# Patient Record
Sex: Male | Born: 1955 | Race: White | Hispanic: No | Marital: Married | Smoking: Current every day smoker
Health system: Southern US, Community
[De-identification: ages and names within clinical notes are randomized; demographics above are authoritative.]

## PROBLEM LIST (undated history)

## (undated) HISTORY — PX: KNEE SURGERY: SHX244

---

## 1998-03-04 ENCOUNTER — Emergency Department (HOSPITAL_COMMUNITY): Admission: EM | Admit: 1998-03-04 | Discharge: 1998-03-04 | Payer: Self-pay | Admitting: Emergency Medicine

## 2010-11-07 ENCOUNTER — Emergency Department (HOSPITAL_COMMUNITY)
Admission: EM | Admit: 2010-11-07 | Discharge: 2010-11-07 | Disposition: A | Discharge status: Home / Self Care | Payer: Self-pay | Attending: Emergency Medicine | Admitting: Emergency Medicine

## 2010-11-07 DIAGNOSIS — Z23 Encounter for immunization: Secondary | ICD-10-CM | POA: Insufficient documentation

## 2010-11-07 DIAGNOSIS — W208XXA Other cause of strike by thrown, projected or falling object, initial encounter: Secondary | ICD-10-CM | POA: Insufficient documentation

## 2010-11-07 DIAGNOSIS — S41109A Unspecified open wound of unspecified upper arm, initial encounter: Secondary | ICD-10-CM | POA: Insufficient documentation

## 2010-11-25 ENCOUNTER — Inpatient Hospital Stay (INDEPENDENT_AMBULATORY_CARE_PROVIDER_SITE_OTHER)
Admission: RE | Admit: 2010-11-25 | Discharge: 2010-11-25 | Disposition: A | Discharge status: Home / Self Care | Payer: Self-pay | Source: Ambulatory Visit | Attending: Family Medicine | Admitting: Family Medicine

## 2010-11-25 DIAGNOSIS — Z4802 Encounter for removal of sutures: Secondary | ICD-10-CM

## 2011-08-08 ENCOUNTER — Encounter: Payer: Self-pay | Admitting: Internal Medicine

## 2011-08-08 ENCOUNTER — Ambulatory Visit (INDEPENDENT_AMBULATORY_CARE_PROVIDER_SITE_OTHER): Payer: Self-pay | Admitting: Internal Medicine

## 2011-08-08 VITALS — BP 114/82 | HR 76 | Temp 97.6°F | Ht 71.0 in | Wt 181.5 lb

## 2011-08-08 DIAGNOSIS — F17200 Nicotine dependence, unspecified, uncomplicated: Secondary | ICD-10-CM | POA: Insufficient documentation

## 2011-08-08 DIAGNOSIS — M1990 Unspecified osteoarthritis, unspecified site: Secondary | ICD-10-CM | POA: Insufficient documentation

## 2011-08-08 DIAGNOSIS — M129 Arthropathy, unspecified: Secondary | ICD-10-CM

## 2011-08-08 DIAGNOSIS — N529 Male erectile dysfunction, unspecified: Secondary | ICD-10-CM

## 2011-08-08 LAB — CBC WITH DIFFERENTIAL/PLATELET
Basophils Absolute: 0 10*3/uL (ref 0.0–0.1)
Basophils Relative: 0 % (ref 0–1)
Eosinophils Absolute: 0.1 10*3/uL (ref 0.0–0.7)
Eosinophils Relative: 1 % (ref 0–5)
HCT: 44.3 % (ref 39.0–52.0)
Hemoglobin: 14.9 g/dL (ref 13.0–17.0)
Lymphocytes Relative: 24 % (ref 12–46)
Lymphs Abs: 2.3 10*3/uL (ref 0.7–4.0)
Neutro Abs: 6.8 10*3/uL (ref 1.7–7.7)
WBC: 9.6 10*3/uL (ref 4.0–10.5)

## 2011-08-08 LAB — LIPID PANEL
HDL: 45 mg/dL (ref >39)
LDL Cholesterol: 65 mg/dL (ref 0–99)
Total CHOL/HDL Ratio: 2.7 Ratio
Triglycerides: 57 mg/dL (ref <150)

## 2011-08-08 NOTE — Patient Instructions (Signed)
Please make a followup appointment in 4-6 weeks. I will give you call if any of the lab tests shows abnormal results.  Meanwhile keep taking with over-the-counter pain medications you take.

## 2011-08-09 ENCOUNTER — Encounter: Payer: Self-pay | Admitting: Internal Medicine

## 2011-08-09 LAB — COMPLETE METABOLIC PANEL WITH GFR
ALT: 11 U/L (ref 0–53)
Alkaline Phosphatase: 65 U/L (ref 39–117)
CO2: 30 mEq/L (ref 19–32)
Creat: 1.15 mg/dL (ref 0.50–1.35)
GFR, Est African American: 82 mL/min
Sodium: 141 mEq/L (ref 135–145)
Total Bilirubin: 0.3 mg/dL (ref 0.3–1.2)
Total Protein: 6.7 g/dL (ref 6.0–8.3)

## 2011-08-09 NOTE — Progress Notes (Signed)
  Subjective:    Patient ID: Clarence Lewis, male    DOB: [birth year], 56 y.o.   MRN: [id number]  HPI patient is a pleasant 56 year old man who comes to the clinic for getting established as a new patient. He hasn't seen a medical doctor for past 20 years. Was seen by orthopedist about 20 years before for his right knee arthroscopic surgery. Unclear about indication, but says that knee was severely swollen.  He does not have any history of hypertension, diabetes, hyperlipidemia- although he has never been checked for any of these. He has not been taking any prescription medication, but does take calcium tablets, fish oil- which he buys from [employer]. 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 He does smoke cigarettes- half-pack a day for last 41 years. Is a social alcohol drinker- 1-2 packs of liquor every week, and occasionally does marijuana- but not regularly.  He does complain of chronic polyarticular pain- about more than 10 years now. He complains of deep nagging pain in bilateral shoulders, elbows, wrists, MCP, PIP joints and 2nd and third right DIP joints, ankles and MTP joints- which gets worse with activity and work. The pain starts after a typical day of work- as he works as Corporate investment banker, has to do heavy physical activity. Does not have significant swelling of any joints- and has no monoarticular swelling and redness. Massage helps joint pain, while heat pads also helps sometimes.  He takes excessive Goody powder, Motrin/Advil- for her chronic pain. During periods of heavy work- he takes about 100 pills of Advil every week. Tylenol does not help him.  Although he does not have any abdominal pain, acid reflux, blood in stool/change in color of stool or nausea or vomiting. He also does not have any chest pain, shortness of breath, constipation, diarrhea, headache, palpitations, syncope, vision changes. Also has no fever, night sweats, recent weight loss or  change in appetite. He rarely gets sick- only in allergy season with sinusitis- which is helped by over-the-counter allergy medications. He does want prescription of Viagra.   Review of Systems    as per history of present illness, all other systems reviewed and negative. Objective:   Physical Exam  Constitutional: Vital signs reviewed.  Patient is a well-developed and well-nourished in no acute distress and cooperative with exam. Alert and oriented x3.  Head: Normocephalic and atraumatic Mouth: no erythema or exudates, MMM Eyes: PERRL, EOMI, conjunctivae normal, No scleral icterus.  Neck: Supple, Trachea midline normal ROM, No JVD, mass, thyromegaly, or carotid bruit present.  Cardiovascular: RRR, S1 normal, S2 normal, no MRG, pulses symmetric and intact bilaterally Pulmonary/Chest: CTAB, no wheezes, rales, or rhonchi Abdominal: Soft. Non-tender, non-distended, bowel sounds are normal, no masses, organomegaly, or guarding present.  Musculoskeletal: No joint deformities, erythema, or stiffness, ROM full and no nontender Neurological: A&O x3, Strenght is normal and symmetric bilaterally, cranial nerve II-XII are grossly intact, no focal motor deficit, sensory intact to light touch bilaterally.  Skin: Warm, dry and intact. No rash, cyanosis, or clubbing.  Psychiatric: Normal mood and affect. speech and behavior is normal. Judgment and thought content normal. Cognition and memory are normal.         Assessment & Plan:

## 2011-08-09 NOTE — Assessment & Plan Note (Addendum)
No apparent etiology surfaces out per his history- except long-term smoking. Although cannot rule out hyperlipidemia, diabetes, hypothyroidism- although does not have any classic symptoms of thyroid or diabetes. A recheck CBC with differential, CMP, TSH and lipid panel.  He wants a prescription of Viagra. I will wait to lab tests come back and I will see him again in 3-4 weeks and will discuss about it at that time.

## 2011-08-09 NOTE — Assessment & Plan Note (Signed)
Smokes about half pack a day for past 41 years. Not ready to quit at present. Explained him that his smoking might be causing his erectile dysfunction.

## 2011-08-09 NOTE — Assessment & Plan Note (Signed)
As per history and physical exam, it seems most likely this patient has osteoarthritis from joint overuse- he do heavy physical work all his life. He does not have any family history of autoimmune disorders or rheumatoid arthritis. He also does not have any B. symptoms and no other signs of an infectious or inflammatory process. I will check basic lab tests today- as he never had any blood work for about 20 years now. - Will check CBC with differential, CMP, TSH, lipid panel. -  I will see him back in 3-4 weeks and discuss the results and we'll try to figure out the best management plan for his arthritis- which will include combination of physical therapy, heat/cold therapy and minimalization of NSAID use. - I do not see any need of imaging at present- will pursue later if needed.

## 2011-08-29 ENCOUNTER — Encounter: Payer: Self-pay | Admitting: Internal Medicine

## 2011-08-29 ENCOUNTER — Ambulatory Visit (INDEPENDENT_AMBULATORY_CARE_PROVIDER_SITE_OTHER): Payer: Self-pay | Admitting: Internal Medicine

## 2011-08-29 VITALS — BP 121/81 | HR 70 | Temp 97.6°F | Ht 71.0 in | Wt 184.2 lb

## 2011-08-29 DIAGNOSIS — Z0000 Encounter for general adult medical examination without abnormal findings: Secondary | ICD-10-CM | POA: Insufficient documentation

## 2011-08-29 DIAGNOSIS — M1990 Unspecified osteoarthritis, unspecified site: Secondary | ICD-10-CM

## 2011-08-29 DIAGNOSIS — M129 Arthropathy, unspecified: Secondary | ICD-10-CM

## 2011-08-29 DIAGNOSIS — N529 Male erectile dysfunction, unspecified: Secondary | ICD-10-CM

## 2011-08-29 DIAGNOSIS — F17200 Nicotine dependence, unspecified, uncomplicated: Secondary | ICD-10-CM

## 2011-08-29 MED ORDER — SILDENAFIL CITRATE 50 MG PO TABS: 50.0000 mg | ORAL_TABLET | ORAL | Status: DC | PRN

## 2011-08-29 MED ORDER — MELOXICAM 7.5 MG PO TABS: 7.5000 mg | ORAL_TABLET | Freq: Every day | ORAL | Status: DC

## 2011-08-29 NOTE — Assessment & Plan Note (Addendum)
As per last note, likely OA from life of labor.  Labs all wnl. - rx of meloxicam trial - defer imaging - told him he can con't taking fish oil and joint supplements as he wishes - consider alternative dx (systemic dz) if next provider feels differently

## 2011-08-29 NOTE — Progress Notes (Signed)
Subjective:     Patient ID: Clarence Lewis, male   DOB: 12-06-1955, 56 y.o.   MRN: [id number]  HPI Very pleasant 56 yo M with arthritis and ED who presents for f/u  Pt c/o diffuse joint pains, intermittent w/o sigficiant edema or erythema.  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  Still has decreased libido, decreased achieve and maintain erections, decreased interest.  Review of Systems No CP, SOB    Objective:   Physical Exam GEN: well nourished, NAD CV: RRR, no m/r/g Chest: CTAB    Assessment:         Plan:

## 2011-08-29 NOTE — Assessment & Plan Note (Signed)
Likely multifactorial, mostly mental, with decreased interest, anxiety about job, money, bills.  May have some vascular component given smoking hx.  Less likely venous prob, doubt androgenic prob.  Pt amenable to viagra trial depending on cost.  Not that concerned about it to begin with. - trial of viagra if pt can afford it - can consider urology referral but pt has no insurance (would need orange card)

## 2011-08-29 NOTE — Assessment & Plan Note (Signed)
New to healthcare system. - colonoscopy: not that interested, but should con't to ask - smoking cessation is biggest risk - lipids wnl - bp wnl

## 2011-08-29 NOTE — Assessment & Plan Note (Signed)
Smokes about 1/2 PPD.  Some interest in quitting but his wife also smokes and she will not quit.  Will be VERY difficult unless she decides to quit with him. - con't to encourage

## 2011-09-05 ENCOUNTER — Encounter: Payer: Self-pay | Admitting: Internal Medicine

## 2011-10-10 ENCOUNTER — Encounter: Payer: Self-pay | Admitting: Internal Medicine

## 2011-10-10 ENCOUNTER — Ambulatory Visit (INDEPENDENT_AMBULATORY_CARE_PROVIDER_SITE_OTHER): Payer: Self-pay | Admitting: Internal Medicine

## 2011-10-10 VITALS — BP 123/87 | HR 72 | Temp 98.3°F | Ht 71.0 in | Wt 185.9 lb

## 2011-10-10 DIAGNOSIS — M129 Arthropathy, unspecified: Secondary | ICD-10-CM

## 2011-10-10 DIAGNOSIS — K029 Dental caries, unspecified: Secondary | ICD-10-CM | POA: Insufficient documentation

## 2011-10-10 DIAGNOSIS — N529 Male erectile dysfunction, unspecified: Secondary | ICD-10-CM

## 2011-10-10 DIAGNOSIS — M1990 Unspecified osteoarthritis, unspecified site: Secondary | ICD-10-CM

## 2011-10-10 MED ORDER — ACETAMINOPHEN-CODEINE #3 300-30 MG PO TABS: 1.0000 | ORAL_TABLET | ORAL | Status: DC | PRN

## 2011-10-10 NOTE — Assessment & Plan Note (Signed)
Viagra didn't help. He does not want to do anything at present. Will think about urology referral in future if needed.

## 2011-10-10 NOTE — Assessment & Plan Note (Signed)
After long discussion- will try Tylenol #3. He was concerned about side effects- "getting high". Discussed with him that this is the lowest narcotic dose. Just give it a try and see if that helps him. We'll try for a while. If he doesn't feel better- will proceed with further workup with imaging

## 2011-10-10 NOTE — Assessment & Plan Note (Signed)
Poor dental hygiene with missing anterior maxillary teeth. Does not have insurance. Does have orange card. I will refer him to dental clinic, and Stanton Kidney, RN will try to make appointment-if possible. Also provided him with free dental clinic information- which he will call.

## 2011-10-10 NOTE — Patient Instructions (Signed)
Please make followup appointment in 3-4 months or earlier if needed for dental workup or other pain. Meanwhile start taking Tylenol#3- 2 times a day as needed for pain.

## 2011-10-10 NOTE — Progress Notes (Signed)
  Subjective:    Patient ID: Clarence Lewis, male    DOB: [birth year], 56 y.o.   MRN: [id number]  HPI patient is a pleasant 56 year old man with past history significant for arthritis, cigarette smoking, erectile dysfunction- who comes the clinic for dental clinic referral. 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 Has multiple dental caries- with bad dental hygiene. Does not have insurance and so have never seen a dentist. Does not have any fever, chills, nausea, vomiting, bowel pain, chest pain, short of breath. - He was started on Viagra during last visit in June 2013 by Dr. Berlinda Last for erectile dysfunction. He took one tablet once- but did not help him with achieving erection- instead make him have palpitations and so he stopped taking it. He does not want to think about it or treat erectile dysfunction at present. - He still has pain all over his body from his arthritis as before- which is not controlled by meloxicam. Does not want to take anything which would make him high.    Review of Systems     is per history of present illness, all other systems reviewed and negative.  Objective:   Physical Exam  General: NAD HEENT: PERRL, EOMI, no scleral icterus. Multiple dental caries. Absent anterior maxillary teeth. Poor dental hygiene Cardiac: S1, S2, RRR, no rubs, murmurs or gallops Pulm: clear to auscultation bilaterally, moving normal volumes of air Abd: soft, nontender, nondistended, BS present Ext: warm and well perfused, no pedal edema Neuro: alert and oriented X3, cranial nerves II-XII grossly intact       Assessment & Plan:

## 2012-04-16 ENCOUNTER — Ambulatory Visit (INDEPENDENT_AMBULATORY_CARE_PROVIDER_SITE_OTHER): Payer: No Typology Code available for payment source | Admitting: Internal Medicine

## 2012-04-16 ENCOUNTER — Encounter: Payer: Self-pay | Admitting: Internal Medicine

## 2012-04-16 VITALS — BP 134/91 | HR 61 | Temp 97.1°F | Ht 71.0 in | Wt 193.1 lb

## 2012-04-16 DIAGNOSIS — Z23 Encounter for immunization: Secondary | ICD-10-CM

## 2012-04-16 DIAGNOSIS — K029 Dental caries, unspecified: Secondary | ICD-10-CM

## 2012-04-16 DIAGNOSIS — M1990 Unspecified osteoarthritis, unspecified site: Secondary | ICD-10-CM

## 2012-04-16 DIAGNOSIS — M129 Arthropathy, unspecified: Secondary | ICD-10-CM

## 2012-04-16 DIAGNOSIS — Z299 Encounter for prophylactic measures, unspecified: Secondary | ICD-10-CM

## 2012-04-16 DIAGNOSIS — F17200 Nicotine dependence, unspecified, uncomplicated: Secondary | ICD-10-CM

## 2012-04-16 DIAGNOSIS — Z0000 Encounter for general adult medical examination without abnormal findings: Secondary | ICD-10-CM

## 2012-04-16 MED ORDER — MELOXICAM 7.5 MG PO TABS: 7.5000 mg | ORAL_TABLET | Freq: Every day | ORAL | Status: AC

## 2012-04-16 MED ORDER — ACETAMINOPHEN-CODEINE #3 300-30 MG PO TABS: 1.0000 | ORAL_TABLET | ORAL | Status: AC | PRN

## 2012-04-16 NOTE — Assessment & Plan Note (Signed)
Still smoking half pack a day. Does not want to quit. Brief counseling given though.

## 2012-04-16 NOTE — Assessment & Plan Note (Addendum)
No significant new changes. She complains of pain in his fingers, elbows and shoulders all the time. It is helped by the medication- cannot remember if his Tylenol No. 3 or Mobic. Will send a prescription for both today and he will check the pill bottle at home and fill the one which helps him. We'll see him back if he has any new changes. He will call for refills.

## 2012-04-16 NOTE — Progress Notes (Signed)
  Subjective:    Patient ID: Clarence Lewis, male    DOB: 1955-06-02, 56 y.o.   MRN: [id number]  HPI patient is a pleasant 29 year man with history of arthritis, smoking and dental caries who comes for regular 6 month followup visit. He has no significant chronic medical problems. He got all his teeth removed 3 weeks ago and is waiting to get dentures- he has an appointment in a week from now. Overall he feels good. Denies any new symptoms. No chest pain, fever, chills, nausea vomiting or abdominal pain, diarrhea. He is out of his pain medications for about 3 months. He is unclear which worked for him- Tylenol #3 or meloxicam. He takes his medications after meals twice a day.   Review of Systems    as per history of present illness. Objective:   Physical Exam General: NAD HEENT: PERRL, EOMI, no scleral icterus Cardiac: RRR, no rubs, murmurs or gallops Pulm: clear to auscultation bilaterally, moving normal volumes of air Abd: soft, nontender, nondistended, BS present Ext: warm and well perfused, no pedal edema. Mild arthritic changes in MCP and PIP joints in bilateral hands. No swelling or redness. Neuro: alert and oriented X3, cranial nerves II-XII grossly intact        Assessment & Plan:

## 2012-04-16 NOTE — Assessment & Plan Note (Signed)
Flu shot given today

## 2012-04-16 NOTE — Patient Instructions (Signed)
Please make a yearly followup appointment or before that as needed for any other new issues. Call the clinic for medication refills. Continue taking all medications as you do.

## 2012-04-16 NOTE — Assessment & Plan Note (Signed)
Got all his teeth removed 3 weeks ago.   Appointment With dentist after a week from now for dentures.

## 2013-04-28 ENCOUNTER — Encounter: Payer: Self-pay | Admitting: Internal Medicine

## 2013-04-28 ENCOUNTER — Ambulatory Visit (INDEPENDENT_AMBULATORY_CARE_PROVIDER_SITE_OTHER): Payer: No Typology Code available for payment source | Admitting: Internal Medicine

## 2013-04-28 VITALS — BP 124/82 | HR 78 | Temp 97.7°F | Wt 188.0 lb

## 2013-04-28 DIAGNOSIS — Z0000 Encounter for general adult medical examination without abnormal findings: Secondary | ICD-10-CM

## 2013-04-28 DIAGNOSIS — M129 Arthropathy, unspecified: Secondary | ICD-10-CM

## 2013-04-28 DIAGNOSIS — M1990 Unspecified osteoarthritis, unspecified site: Secondary | ICD-10-CM

## 2013-04-28 DIAGNOSIS — F17200 Nicotine dependence, unspecified, uncomplicated: Secondary | ICD-10-CM

## 2013-04-28 LAB — CBC WITH DIFFERENTIAL/PLATELET
BASOS PCT: 0 % (ref 0–1)
Basophils Absolute: 0 10*3/uL (ref 0.0–0.1)
Eosinophils Absolute: 0.1 10*3/uL (ref 0.0–0.7)
Eosinophils Relative: 1 % (ref 0–5)
HEMATOCRIT: 45 % (ref 39.0–52.0)
HEMOGLOBIN: 15.3 g/dL (ref 13.0–17.0)
LYMPHS ABS: 2.7 10*3/uL (ref 0.7–4.0)
LYMPHS PCT: 25 % (ref 12–46)
MCH: 30.8 pg (ref 26.0–34.0)
MCHC: 34 g/dL (ref 30.0–36.0)
MCV: 90.5 fL (ref 78.0–100.0)
MONO ABS: 0.7 10*3/uL (ref 0.1–1.0)
MONOS PCT: 6 % (ref 3–12)
NEUTROS ABS: 7.2 10*3/uL (ref 1.7–7.7)
NEUTROS PCT: 68 % (ref 43–77)
Platelets: 236 10*3/uL (ref 150–400)
RBC: 4.97 MIL/uL (ref 4.22–5.81)
RDW: 14.6 % (ref 11.5–15.5)
WBC: 10.7 10*3/uL — AB (ref 4.0–10.5)

## 2013-04-28 LAB — BASIC METABOLIC PANEL WITH GFR
BUN: 10 mg/dL (ref 6–23)
CHLORIDE: 105 meq/L (ref 96–112)
CO2: 29 mEq/L (ref 19–32)
Calcium: 9.2 mg/dL (ref 8.4–10.5)
Creat: 1.16 mg/dL (ref 0.50–1.35)
GFR, EST AFRICAN AMERICAN: 81 mL/min
GFR, Est Non African American: 70 mL/min
GLUCOSE: 72 mg/dL (ref 70–99)
POTASSIUM: 4.1 meq/L (ref 3.5–5.3)
SODIUM: 142 meq/L (ref 135–145)

## 2013-04-28 LAB — LIPID PANEL
CHOL/HDL RATIO: 2.9 ratio
Cholesterol: 127 mg/dL (ref 0–200)
HDL: 44 mg/dL (ref >39)
LDL CALC: 53 mg/dL (ref 0–99)
TRIGLYCERIDES: 152 mg/dL — AB (ref <150)
VLDL: 30 mg/dL (ref 0–40)

## 2013-04-28 MED ORDER — MELOXICAM 7.5 MG PO TABS: 7.5000 mg | ORAL_TABLET | Freq: Every day | ORAL | Status: AC

## 2013-04-28 NOTE — Progress Notes (Signed)
Subjective:   Patient ID: Clarence Lewis male   DOB: May 09, 1955 58 y.o.   MRN: [id number]  HPI: Clarence Lewis is a 58 y.o. man who presents with a cc of bil hand pain. This pain has been present for many years. The pain was treated well with a medicine that the patient used about 6 months ago, but he cannot remember the medicine. The pain is made worse with physical activity. 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 The pain is located in his MCP and PIP joints. The pain is worse in the patients right hand. The patient is currently not taking any medicine for hit.    No past medical history on file. Current Outpatient Prescriptions  Medication Sig Dispense Refill  . fish oil-omega-3 fatty acids 1000 MG capsule Take 2 g by mouth daily.      . sildenafil (VIAGRA) 50 MG tablet Take 1 tablet (50 mg total) by mouth as needed for erectile dysfunction.  10 tablet  1   No current facility-administered medications for this visit.   Family History  Problem Relation Age of Onset  . Diabetes Mother   . Cancer Mother 1855    died of bone cancer   History   Social History  . Marital Status: Married    Spouse Name: N/A    Number of Children: N/A  . Years of Education: N/A   Social History Main Topics  . Smoking status: Current Every Day Smoker -- 1.00 packs/day for 41 years    Types: Cigarettes  . Smokeless tobacco: None  . Alcohol Use: 1.2 oz/week    2 Shots of liquor per week  . Drug Use: No  . Sexual Activity: Yes   Other Topics Concern  . None   Social History Narrative   Lives with [place] in [place].   Has kids, who lives in [place] and are healthy.   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   Plays guitar since he was 58 years old.   Review of Systems: Review of Systems  Constitutional: Negative for fever, chills and weight loss.  HENT: Negative  for sore throat.   Respiratory: Negative for cough, shortness of breath and wheezing.   Cardiovascular: Negative for chest pain, palpitations and orthopnea.  Gastrointestinal: Negative for heartburn, nausea, vomiting, abdominal pain, diarrhea and constipation.  Genitourinary: Negative for dysuria, urgency and frequency.  Musculoskeletal: Positive for joint pain.  Neurological: Negative for weakness and headaches.    Objective:  Physical Exam: Filed Vitals:   04/28/13 1412  BP: 124/82  Pulse: 78  Temp: 97.7 F (36.5 C)  TempSrc: Oral  Weight: 188 lb (85.276 kg)  SpO2: 96%   Physical Exam  Constitutional: He is oriented to person, place, and time. He appears well-developed and well-nourished.  HENT:  Head: Normocephalic.  Mouth/Throat: Oropharynx is clear and moist. No oropharyngeal exudate.  Cardiovascular: Normal rate, regular rhythm, normal heart sounds and intact distal pulses.  Exam reveals no gallop and no friction rub.   No murmur heard. Pulmonary/Chest: Effort normal. No respiratory distress. He has wheezes.  Mild wheezing in right lung  Musculoskeletal:  Patient has full motot strength in bil hands (grip), he is able to eprform rapid alternating movements, he can oppose his thumb w/o difficulty, wriste flexion and extension are normal. Biceps and triceps are 5/5. Intact to sensation bil. Pulses 2+  bil.  DIP tender to palpation. Pain increases with grip strength. Right is worse than left.   Neurological: He is alert and oriented to person, place, and time.  Psychiatric: He has a normal mood and affect. His behavior is normal.    Assessment & Plan:

## 2013-04-28 NOTE — Patient Instructions (Signed)
Please take 1 meloxicam each day for your arthritis.   I will call you with the results of your blood work,.   Please complete the stool cards and send them for analysis. You will be contactd with the results.   Arthritis, Nonspecific Arthritis is inflammation of a joint. This usually means pain, redness, warmth or swelling are present. One or more joints may be involved. There are a number of types of arthritis. Your caregiver may not be able to tell what type of arthritis you have right away. CAUSES  The most common cause of arthritis is the wear and tear on the joint (osteoarthritis). This causes damage to the cartilage, which can break down over time. The knees, hips, back and neck are most often affected by this type of arthritis. Other types of arthritis and common causes of joint pain include:  Sprains and other injuries near the joint. Sometimes minor sprains and injuries cause pain and swelling that develop hours later.  Rheumatoid arthritis. This affects hands, feet and knees. It usually affects both sides of your body at the same time. It is often associated with chronic ailments, fever, weight loss and general weakness.  Crystal arthritis. Gout and pseudo gout can cause occasional acute severe pain, redness and swelling in the foot, ankle, or knee.  Infectious arthritis. Bacteria can get into a joint through a break in overlying skin. This can cause infection of the joint. Bacteria and viruses can also spread through the blood and affect your joints.  Drug, infectious and allergy reactions. Sometimes joints can become mildly painful and slightly swollen with these types of illnesses. SYMPTOMS   Pain is the main symptom.  Your joint or joints can also be red, swollen and warm or hot to the touch.  You may have a fever with certain types of arthritis, or even feel overall ill.  The joint with arthritis will hurt with movement. Stiffness is present with some types of  arthritis. DIAGNOSIS  Your caregiver will suspect arthritis based on your description of your symptoms and on your exam. Testing may be needed to find the type of arthritis:  Blood and sometimes urine tests.  X-ray tests and sometimes CT or MRI scans.  Removal of fluid from the joint (arthrocentesis) is done to check for bacteria, crystals or other causes. Your caregiver (or a specialist) will numb the area over the joint with a local anesthetic, and use a needle to remove joint fluid for examination. This procedure is only minimally uncomfortable.  Even with these tests, your caregiver may not be able to tell what kind of arthritis you have. Consultation with a specialist (rheumatologist) may be helpful. TREATMENT  Your caregiver will discuss with you treatment specific to your type of arthritis. If the specific type cannot be determined, then the following general recommendations may apply. Treatment of severe joint pain includes:  Rest.  Elevation.  Anti-inflammatory medication (for example, ibuprofen) may be prescribed. Avoiding activities that cause increased pain.  Only take over-the-counter or prescription medicines for pain and discomfort as recommended by your caregiver.  Cold packs over an inflamed joint may be used for 10 to 15 minutes every hour. Hot packs sometimes feel better, but do not use overnight. Do not use hot packs if you are diabetic without your caregiver's permission.  A cortisone shot into arthritic joints may help reduce pain and swelling.  Any acute arthritis that gets worse over the next 1 to 2 days needs to be looked at  to be sure there is no joint infection. Long-term arthritis treatment involves modifying activities and lifestyle to reduce joint stress jarring. This can include weight loss. Also, exercise is needed to nourish the joint cartilage and remove waste. This helps keep the muscles around the joint strong. HOME CARE INSTRUCTIONS   Do not take  aspirin to relieve pain if gout is suspected. This elevates uric acid levels.  Only take over-the-counter or prescription medicines for pain, discomfort or fever as directed by your caregiver.  Rest the joint as much as possible.  If your joint is swollen, keep it elevated.  Use crutches if the painful joint is in your leg.  Drinking plenty of fluids may help for certain types of arthritis.  Follow your caregiver's dietary instructions.  Try low-impact exercise such as:  Swimming.  Water aerobics.  Biking.  Walking.  Morning stiffness is often relieved by a warm shower.  Put your joints through regular range-of-motion. SEEK MEDICAL CARE IF:   You do not feel better in 24 hours or are getting worse.  You have side effects to medications, or are not getting better with treatment. SEEK IMMEDIATE MEDICAL CARE IF:   You have a fever.  You develop severe joint pain, swelling or redness.  Many joints are involved and become painful and swollen.  There is severe back pain and/or leg weakness.  You have loss of bowel or bladder control. Document Released: 04/19/2004 Document Revised: 06/04/2011 Document Reviewed: 05/05/2008 [facility] Patient Information 2014 [employer] [place].

## 2013-04-29 NOTE — Assessment & Plan Note (Signed)
A+P: I discussed the various options for treating th patients hand pain. He requested to minimize the cost as he has limited financial flexibility. I suggested heat and ice. I suggested that the patient restart meloxicam as this worked for him before (per chart review). If the meloxicam is not working for him, then I suggested that he call me back and we can talk over the phone. Further, I ordered basic labs to ensure kidney function is stable.

## 2013-04-29 NOTE — Assessment & Plan Note (Signed)
A+P: The patient elected to complete the stool cards as screening for colon cancer.

## 2013-04-29 NOTE — Progress Notes (Signed)
Case discussed with Dr. Komanski at the time of the visit.  We reviewed the resident's history and exam and pertinent patient test results.  I agree with the assessment, diagnosis, and plan of care documented in the resident's note.      

## 2013-04-29 NOTE — Assessment & Plan Note (Signed)
A+P: I spoke with the patient at length about the benefits of smoking cessation. The patient thinks that he would be able to do it were it not for his wife who is constantly smoking at home. I encourage the patient to talk with his wife about smoking cessation. He stated that he would try but was not very hopeful. I will follow up with the patient on this topic at our next visit.

## 2013-06-10 LAB — HEMOCCULT SLIDES (X 3 CARDS)

## 2013-10-05 ENCOUNTER — Ambulatory Visit (INDEPENDENT_AMBULATORY_CARE_PROVIDER_SITE_OTHER): Payer: No Typology Code available for payment source | Admitting: Family Medicine

## 2013-10-05 ENCOUNTER — Encounter: Payer: Self-pay | Admitting: Family Medicine

## 2013-10-05 VITALS — BP 147/92 | Ht 70.0 in | Wt 192.0 lb

## 2013-10-05 DIAGNOSIS — M722 Plantar fascial fibromatosis: Secondary | ICD-10-CM

## 2013-10-05 MED ORDER — METHYLPREDNISOLONE ACETATE 40 MG/ML IJ SUSP
40.0000 mg | Freq: Once | INTRAMUSCULAR | Status: AC
  Administered 2013-10-05: 40 mg via INTRA_ARTICULAR

## 2013-10-05 NOTE — Assessment & Plan Note (Signed)
   Steroid injection performed today  Longitudinal arch supports place  Procedure note: Plantar fasciitis steroid injection Consent obtained and verified. Sterile betadine prep. Topical analgesic spray: Ethyl chloride. Approached in typical fashion with: Medial approach Completed without difficulty Meds: 1 cc Depo-Medrol & 3 cc lidocaine Needle: 25-gauge Aftercare instructions and Red flags advised.

## 2013-10-05 NOTE — Progress Notes (Signed)
  Clarence Lewis - 58 y.o. male MRN [id number]  Date of birth: 11-29-1955    SUBJECTIVE:     Mr. Clarence Lewis complains today of right plantar heel pain that has been occurring for approximately 6 months.  Pain is worse first in the morning that improves slightly then progressively gets worse as the day goes on.  He is tried some "arthritis medicine" which has not helped with the pain.  He reports a previous right ankle fracture at age 58, but no recent trauma.   ROS:     Denies fevers, chills, or erythema.   PERTINENT  PMH / PSH FH / / SH:  Past Medical, Surgical, Social, and Family History Reviewed & Updated in the EMR.  Pertinent findings include:    History of Right ankle fracture (age 58)  OBJECTIVE: BP 147/92  Ht 5\' 10"  (1.778 m)  Wt 192 lb (87.091 kg)  BMI 27.55 kg/m2  Physical Exam:  Vital signs are reviewed.  Rt Foot: Inspection:   No visible swelling, erythema, or ulcers  Arch: Normal w/o pes cavus or planus   Toes: no claw or hammer deformity   Palpation:   Tenderness on plantar aspect of calcaneus  ROM: Full in plantarflexion, dorsiflexion, inversion, and eversion of the foot; flexion and extension of the toes  Strength: 5/5 in all directions but unable stand Sensation: intact Vascular: intact w/ dorsalis pedis & posterior tibialis pulses 2+  Stable lateral and medial ligaments; Negative Anterior drawer test  ASSESSMENT & PLAN:  See problem based charting & AVS for pt instructions.

## 2013-10-06 NOTE — Progress Notes (Signed)
[facility] Attending Note: I have seen and examined this patient. I have discussed this patient with the resident and reviewed the assessment and plan as documented above. I agree with the resident's findings and plan.  

## 2013-11-02 ENCOUNTER — Ambulatory Visit: Payer: No Typology Code available for payment source | Admitting: Family Medicine

## 2013-11-09 ENCOUNTER — Ambulatory Visit: Payer: No Typology Code available for payment source | Admitting: Family Medicine

## 2013-11-11 ENCOUNTER — Ambulatory Visit (INDEPENDENT_AMBULATORY_CARE_PROVIDER_SITE_OTHER): Payer: No Typology Code available for payment source | Admitting: Family Medicine

## 2013-11-11 ENCOUNTER — Encounter: Payer: Self-pay | Admitting: Family Medicine

## 2013-11-11 VITALS — BP 135/85 | HR 77 | Ht 71.0 in | Wt 190.0 lb

## 2013-11-11 DIAGNOSIS — M25511 Pain in right shoulder: Secondary | ICD-10-CM

## 2013-11-11 DIAGNOSIS — M25519 Pain in unspecified shoulder: Secondary | ICD-10-CM

## 2013-11-11 MED ORDER — METHYLPREDNISOLONE ACETATE 40 MG/ML IJ SUSP
40.0000 mg | Freq: Once | INTRAMUSCULAR | Status: AC
  Administered 2013-11-11: 40 mg via INTRA_ARTICULAR

## 2013-11-12 NOTE — Progress Notes (Signed)
Patient ID: Janyce LlanosJeffrey Vankuren, male   DOB: January 10, 1956, 58 y.o.   MRN: [id number]  Janyce LlanosJeffrey Vanmetre - 58 y.o. male MRN [id number]  Date of birth: January 10, 1956    SUBJECTIVE:     Followup left fasciitis. Last office visit we gave him an ejection. He is 75% better. Is wearing a shoe insert #2. Followup right shoulder pain. Especially painful she does overhead activities. He works as a Nature conservation officerlaborer. This is going on for to 6 months. Gradually getting worse. Over the last 2 weeks she's also had some pain in the anterior shoulder if he lifts anything heavy.3 ROS:     No fever, sweats, chills. No numbness or tingling in his right upper extremity.  PERTINENT  PMH / PSH FH / / SH:  Past Medical, Surgical, Social, and Family History Reviewed & Updated in the EMR.  Pertinent findings include:  No personal history of diabetes mellitus. He is a smoker. No history of shoulder injury surgery on the right.  OBJECTIVE: BP 135/85  Pulse 77  Ht 5\' 11"  (1.803 m)  Wt 190 lb (86.183 kg)  BMI 26.51 kg/m2  Physical Exam:  Vital signs are reviewed. GENERAL: Well-developed male no acute distress Shoulder: Right. Tender to palpation over the biceps tendon. Positive impingement signs. Painful supraspinatus testing but strength is intact in all planes the rotator cuff. Distally he is neurovascularly intact.  INJECTION: Patient was given informed consent, signed copy in the chart. Appropriate time out was taken. Area prepped and draped in usual sterile fashion. One cc of methylprednisolone 40 mg/ml plus  4 cc of 1% lidocaine without epinephrine was injected into the right subacromial bursa using a(n) posterior approach. The patient tolerated the procedure well. There were no complications. Post procedure instructions were given.  INJECTION: Patient was given informed consent, signed copy in the chart. Appropriate time out was taken. Area prepped and draped in usual sterile fashion. One cc of methylprednisolone 40 mg/ml  plus  one cc of 1% lidocaine without epinephrine was injected into the tendon sheath of the biceps tendon using a(n) anterior approach. The patient tolerated the procedure well. There were no complications. Post procedure instructions were given.  ASSESSMENT & PLAN:  See problem based charting & AVS for pt instructions. Subacromial bursitis and bicipital tendinitis. Gave him injections today place him on home exercise program. Olive 4-6 weeks.

## 2014-02-08 ENCOUNTER — Ambulatory Visit (INDEPENDENT_AMBULATORY_CARE_PROVIDER_SITE_OTHER): Payer: Self-pay | Admitting: Pulmonary Disease

## 2014-02-08 ENCOUNTER — Encounter: Payer: Self-pay | Admitting: Pulmonary Disease

## 2014-02-08 ENCOUNTER — Ambulatory Visit (INDEPENDENT_AMBULATORY_CARE_PROVIDER_SITE_OTHER): Payer: Self-pay | Admitting: *Deleted

## 2014-02-08 VITALS — BP 120/85 | HR 83 | Temp 98.0°F | Ht 71.0 in | Wt 193.0 lb

## 2014-02-08 DIAGNOSIS — Z23 Encounter for immunization: Secondary | ICD-10-CM

## 2014-02-08 DIAGNOSIS — M1990 Unspecified osteoarthritis, unspecified site: Secondary | ICD-10-CM

## 2014-02-08 DIAGNOSIS — Z720 Tobacco use: Secondary | ICD-10-CM

## 2014-02-08 DIAGNOSIS — F17200 Nicotine dependence, unspecified, uncomplicated: Secondary | ICD-10-CM

## 2014-02-08 DIAGNOSIS — Z0000 Encounter for general adult medical examination without abnormal findings: Secondary | ICD-10-CM

## 2014-02-08 NOTE — Assessment & Plan Note (Addendum)
Assessment: Progress toward smoking cessation:  smoking the same amount Barriers to progress toward smoking cessation:  lack of motivation to quit Comments: Wife continues to smoke  Plan: Instruction/counseling given:  I counseled patient on the dangers of tobacco use, advised patient to stop smoking, and reviewed strategies to maximize success. Medications to assist with smoking cessation:  Patient declined

## 2014-02-08 NOTE — Patient Instructions (Addendum)
General Instructions:   Please bring your medicines with you each time you come to clinic.  Medicines may include prescription medications, over-the-counter medications, herbal remedies, eye drops, vitamins, or other pills.   Progress Toward Treatment Goals:  Treatment Goal 02/08/2014  Stop smoking smoking the same amount    Self Care Goals & Plans:  Self Care Goal 02/08/2014  Manage my medications bring my medications to every visit  Monitor my health -  Eat healthy foods eat foods that are low in salt  Be physically active -  Stop smoking go to the [employer] website (PumpkinSearch.com.eewww.quitlinenc.com); call [employer] (1-800-QUIT-NOW)    No flowsheet data found.   Care Management & Community Referrals:  Referral 02/08/2014  Referrals made for care management support none needed       Health Maintenance A healthy lifestyle and preventative care can promote health and wellness.  Maintain regular health, dental, and eye exams.  Eat a healthy diet. Foods like vegetables, fruits, whole grains, low-fat dairy products, and lean protein foods contain the nutrients you need and are low in calories. Decrease your intake of foods high in solid fats, added sugars, and salt. Get information about a proper diet from your health care provider, if necessary.  Regular physical exercise is one of the most important things you can do for your health. Most adults should get at least 150 minutes of moderate-intensity exercise (any activity that increases your heart rate and causes you to sweat) each week. In addition, most adults need muscle-strengthening exercises on 2 or more days a week.   Maintain a healthy weight. The body mass index (BMI) is a screening tool to identify possible weight problems. It provides an estimate of body fat based on height and weight. Your health care provider can find your BMI and can help you achieve or maintain a healthy weight. For males 20 years and older:  A BMI below  18.5 is considered underweight.  A BMI of 18.5 to 24.9 is normal.  A BMI of 25 to 29.9 is considered overweight.  A BMI of 30 and above is considered obese.  Maintain normal blood lipids and cholesterol by exercising and minimizing your intake of saturated fat. Eat a balanced diet with plenty of fruits and vegetables. Blood tests for lipids and cholesterol should begin at age 57 and be repeated every 5 years. If your lipid or cholesterol levels are high, you are over age 57, or you are at high risk for heart disease, you may need your cholesterol levels checked more frequently.Ongoing high lipid and cholesterol levels should be treated with medicines if diet and exercise are not working.  If you smoke, find out from your health care provider how to quit. If you do not use tobacco, do not start.  Lung cancer screening is recommended for adults aged 55-80 years who are at high risk for developing lung cancer because of a history of smoking. A yearly low-dose CT scan of the lungs is recommended for people who have at least a 30-pack-year history of smoking and are current smokers or have quit within the past 15 years. A pack year of smoking is smoking an average of 1 pack of cigarettes a day for 1 year (for example, a 30-pack-year history of smoking could mean smoking 1 pack a day for 30 years or 2 packs a day for 15 years). Yearly screening should continue until the smoker has stopped smoking for at least 15 years. Yearly screening should be stopped for  people who develop a health problem that would prevent them from having lung cancer treatment.  If you choose to drink alcohol, do not have more than 2 drinks per day. One drink is considered to be 12 oz (360 mL) of beer, 5 oz (150 mL) of wine, or 1.5 oz (45 mL) of liquor.  Avoid the use of street drugs. Do not share needles with anyone. Ask for help if you need support or instructions about stopping the use of drugs.  High blood pressure causes  heart disease and increases the risk of stroke. Blood pressure should be checked at least every 1-2 years. Ongoing high blood pressure should be treated with medicines if weight loss and exercise are not effective.  If you are 6845-57 years old, ask your health care provider if you should take aspirin to prevent heart disease.  Diabetes screening involves taking a blood sample to check your fasting blood sugar level. This should be done once every 3 years after age 57 if you are at a normal weight and without risk factors for diabetes. Testing should be considered at a younger age or be carried out more frequently if you are overweight and have at least 1 risk factor for diabetes.  Colorectal cancer can be detected and often prevented. Most routine colorectal cancer screening begins at the age of 57 and continues through age 57. However, your health care provider may recommend screening at an earlier age if you have risk factors for colon cancer. On a yearly basis, your health care provider may provide home test kits to check for hidden blood in the stool. A small camera at the end of a tube may be used to directly examine the colon (sigmoidoscopy or colonoscopy) to detect the earliest forms of colorectal cancer. Talk to your health care provider about this at age 57 when routine screening begins. A direct exam of the colon should be repeated every 5-10 years through age 875, unless early forms of precancerous polyps or small growths are found.  People who are at an increased risk for hepatitis B should be screened for this virus. You are considered at high risk for hepatitis B if:  You were born in a country where hepatitis B occurs often. Talk with your health care provider about which countries are considered high risk.  Your parents were born in a high-risk country and you have not received a shot to protect against hepatitis B (hepatitis B vaccine).  You have HIV or AIDS.  You use needles to inject  street drugs.  You live with, or have sex with, someone who has hepatitis B.  You are a man who has sex with other men (MSM).  You get hemodialysis treatment.  You take certain medicines for conditions like cancer, organ transplantation, and autoimmune conditions.  Hepatitis C blood testing is recommended for all people born from [id number] through 1965 and any individual with known risk factors for hepatitis C.  Healthy men should no longer receive prostate-specific antigen (PSA) blood tests as part of routine cancer screening. Talk to your health care provider about prostate cancer screening.  Testicular cancer screening is not recommended for adolescents or adult males who have no symptoms. Screening includes self-exam, a health care provider exam, and other screening tests. Consult with your health care provider about any symptoms you have or any concerns you have about testicular cancer.  Practice safe sex. Use condoms and avoid high-risk sexual practices to reduce the spread of sexually transmitted  infections (STIs).  You should be screened for STIs, including gonorrhea and chlamydia if:  You are sexually active and are younger than 24 years.  You are older than 24 years, and your health care provider tells you that you are at risk for this type of infection.  Your sexual activity has changed since you were last screened, and you are at an increased risk for chlamydia or gonorrhea. Ask your health care provider if you are at risk.  If you are at risk of being infected with HIV, it is recommended that you take a prescription medicine daily to prevent HIV infection. This is called pre-exposure prophylaxis (PrEP). You are considered at risk if:  You are a man who has sex with other men (MSM).  You are a heterosexual man who is sexually active with multiple partners.  You take drugs by injection.  You are sexually active with a partner who has HIV.  Talk with your health care provider  about whether you are at high risk of being infected with HIV. If you choose to begin PrEP, you should first be tested for HIV. You should then be tested every 3 months for as long as you are taking PrEP.  Use sunscreen. Apply sunscreen liberally and repeatedly throughout the day. You should seek shade when your shadow is shorter than you. Protect yourself by wearing long sleeves, pants, a wide-brimmed hat, and sunglasses year round whenever you are outdoors.  Tell your health care provider of new moles or changes in moles, especially if there is a change in shape or color. Also, tell your health care provider if a mole is larger than the size of a pencil eraser.  A one-time screening for abdominal aortic aneurysm (AAA) and surgical repair of large AAAs by ultrasound is recommended for men aged 65-75 years who are current or former smokers.  Stay current with your vaccines (immunizations). Document Released: 09/08/2007 Document Revised: 03/17/2013 Document Reviewed: 08/07/2010 [facility] Patient Information 2015 [place], [place]. This information is not intended to replace advice given to you by your health care provider. Make sure you discuss any questions you have with your health care provider.

## 2014-02-08 NOTE — Progress Notes (Signed)
Patient ID: Clarence LlanosJeffrey Lewis, male   DOB: 1955-07-28, 58 y.o.   MRN: [id number] [facility] Attending  I saw and evaluated the patient.  I personally confirmed the key portions of the history and exam documented by Dr. Isabella BowensKrall and I reviewed pertinent patient test results.  The assessment, diagnosis, and plan were formulated together and I agree with the documentation in the resident's note.

## 2014-02-08 NOTE — Progress Notes (Signed)
   Subjective:    Patient ID: Clarence LlanosJeffrey Lewis, male    DOB: [birth year], 58 y.o.   MRN: [id number]  HPI: Clarence Lewis is a 58 year old man presenting for routine follow up.  Hand pain: chronic, no exacerbations. Takes mobic daily prn. Has been seeing sports medicine. Chronic joint pains improving overall.   Cigarette use: 1/2 ppd. Difficulty quitting because wife smokes as well. Not ready to quit.  Review of Systems  Constitutional: Negative for fever and chills.  HENT: Negative for congestion and sore throat.   Eyes: Negative for pain and visual disturbance.  Respiratory: Negative for cough and shortness of breath.   Cardiovascular: Negative for chest pain.  Gastrointestinal: Negative for nausea, vomiting, abdominal pain, diarrhea and constipation.  Endocrine: Negative for polydipsia and polyuria.  Genitourinary: Negative for dysuria, hematuria and difficulty urinating.  Musculoskeletal: Positive for arthralgias (chronic).  Skin: Negative for rash.  Neurological: Negative for headaches.  Psychiatric/Behavioral: Negative for suicidal ideas.   No past medical history on file.  Outpatient Prescriptions Prior to Visit  Medication Sig Dispense Refill  . fish oil-omega-3 fatty acids 1000 MG capsule Take 2 g by mouth daily.    . meloxicam (MOBIC) 7.5 MG tablet Take 1 tablet (7.5 mg total) by mouth daily. (Patient taking differently: Take 7.5 mg by mouth daily as needed. ) 30 tablet 6  . sildenafil (VIAGRA) 50 MG tablet Take 1 tablet (50 mg total) by mouth as needed for erectile dysfunction. 10 tablet 1   No facility-administered medications prior to visit.    Today's Vitals   02/08/14 1341 02/08/14 1342  BP: 120/85   Pulse: 83   Temp: 98 F (36.7 C)   TempSrc: Oral   Height: 5\' 11"  (1.803 m)   Weight: 193 lb (87.544 kg)   SpO2: 96%   PainSc: 8  6   PainLoc: Hand    Objective:   Physical Exam  Constitutional: He is oriented to person, place, and time. He  appears well-developed and well-nourished. No distress.  HENT:  Head: Normocephalic and atraumatic.  Mouth/Throat: Oropharynx is clear and moist.  Eyes: EOM are normal. Pupils are equal, round, and reactive to light.  Neck: Neck supple.  Cardiovascular: Normal rate and regular rhythm.  Exam reveals no gallop and no friction rub.   No murmur heard. Pulmonary/Chest: Effort normal and breath sounds normal. He has no wheezes.  Abdominal: Soft. There is no tenderness.  Musculoskeletal: Normal range of motion.  Lymphadenopathy:    He has no cervical adenopathy.  Neurological: He is alert and oriented to person, place, and time.  Skin: Skin is warm and dry. No rash noted.   Assessment & Plan:  Please refer to problem based charting

## 2014-02-08 NOTE — Assessment & Plan Note (Signed)
Assessment: Overall improved.  Plan: Continue to follow up with sports medicine and take Mobic daily as needed.

## 2014-02-08 NOTE — Assessment & Plan Note (Addendum)
Assessment: Last had routine labs and stool guaiac February 2015.  Plan: Will have patient follow up in 3 months for repeat stool guaiac and routine labs.  Influenza vaccine administered today.

## 2014-05-14 ENCOUNTER — Ambulatory Visit (INDEPENDENT_AMBULATORY_CARE_PROVIDER_SITE_OTHER): Payer: Self-pay | Admitting: Internal Medicine

## 2014-05-14 ENCOUNTER — Other Ambulatory Visit: Payer: Self-pay | Admitting: Internal Medicine

## 2014-05-14 ENCOUNTER — Encounter: Payer: Self-pay | Admitting: Internal Medicine

## 2014-05-14 VITALS — BP 139/91 | HR 74 | Temp 98.2°F | Ht 71.0 in | Wt 199.8 lb

## 2014-05-14 DIAGNOSIS — T733XXA Exhaustion due to excessive exertion, initial encounter: Secondary | ICD-10-CM

## 2014-05-14 DIAGNOSIS — Z720 Tobacco use: Secondary | ICD-10-CM

## 2014-05-14 DIAGNOSIS — F17200 Nicotine dependence, unspecified, uncomplicated: Secondary | ICD-10-CM

## 2014-05-14 DIAGNOSIS — R5383 Other fatigue: Secondary | ICD-10-CM | POA: Insufficient documentation

## 2014-05-14 DIAGNOSIS — Z0000 Encounter for general adult medical examination without abnormal findings: Secondary | ICD-10-CM

## 2014-05-14 LAB — COMPREHENSIVE METABOLIC PANEL
ALK PHOS: 76 U/L (ref 39–117)
ALT: 22 U/L (ref 0–53)
AST: 19 U/L (ref 0–37)
Albumin: 4.2 g/dL (ref 3.5–5.2)
BUN: 12 mg/dL (ref 6–23)
CHLORIDE: 104 meq/L (ref 96–112)
CO2: 28 mEq/L (ref 19–32)
CREATININE: 1.17 mg/dL (ref 0.50–1.35)
Calcium: 9.2 mg/dL (ref 8.4–10.5)
GLUCOSE: 74 mg/dL (ref 70–99)
POTASSIUM: 4.6 meq/L (ref 3.5–5.3)
Sodium: 139 mEq/L (ref 135–145)
Total Bilirubin: 0.4 mg/dL (ref 0.2–1.2)
Total Protein: 6.6 g/dL (ref 6.0–8.3)

## 2014-05-14 LAB — CBC
HCT: 47.6 % (ref 39.0–52.0)
Hemoglobin: 16 g/dL (ref 13.0–17.0)
MCH: 29.6 pg (ref 26.0–34.0)
MCHC: 33.6 g/dL (ref 30.0–36.0)
MCV: 88 fL (ref 78.0–100.0)
MPV: 9.6 fL (ref 8.6–12.4)
PLATELETS: 268 10*3/uL (ref 150–400)
RBC: 5.41 MIL/uL (ref 4.22–5.81)
RDW: 14.5 % (ref 11.5–15.5)
WBC: 8.2 10*3/uL (ref 4.0–10.5)

## 2014-05-14 NOTE — Progress Notes (Signed)
Medicine attending: Medical history, presenting problems, physical findings, and medications, reviewed with Dr Rushil Patel and I concur with his evaluation and management plan. 

## 2014-05-14 NOTE — Progress Notes (Signed)
   Subjective:    Patient ID: Clarence Lewis, male    DOB: Apr 25, 1955, 59 y.o.   MRN: [id number]  HPI Mr. Clarence Lewis is a 59 year old male with tobacco abuse, plantar fasciitis who presents today for follow-up visit. Please see assessment & plan for documentation of each problem.   Review of Systems  Constitutional: Negative for fever.  Respiratory: Negative for shortness of breath.   Cardiovascular: Negative for chest pain.  Gastrointestinal: Negative for nausea, vomiting, abdominal pain, diarrhea and blood in stool.  Genitourinary: Negative for hematuria and difficulty urinating.  Neurological: Negative for dizziness and weakness.  Psychiatric/Behavioral: Negative for sleep disturbance and decreased concentration.       Objective:   Physical Exam Constitutional: He is oriented to person, place, and time. He appears well-developed and well-nourished. No distress.  HENT:  Head: Normocephalic and atraumatic.  Eyes: Conjunctivae are normal. Pupils are equal, round, and reactive to light.  Cardiovascular: Normal rate, regular rhythm and normal heart sounds.  Exam reveals no gallop and no friction rub.   No murmur heard. Pulmonary/Chest: Effort normal. No respiratory distress. He has no wheezes. He has no rales.  Abdominal: Soft. Bowel sounds are normal. He exhibits no distension. There is no tenderness.  Extremities: No tibial edema noted  Neurological: He is alert and oriented to person, place, and time. No cranial nerve deficit. Coordination normal.  Skin: Skin is warm and dry. He is not diaphoretic.  Psychiatric: His behavior is normal.          Assessment & Plan:

## 2014-05-14 NOTE — Assessment & Plan Note (Signed)
  Assessment: Progress toward smoking cessation:  smoking the same amount Barriers to progress toward smoking cessation:  lack of motivation to quit, other tobacco users at home, lack of understanding of harms of tobacco Comments:  -He reports smoking one to one and half packs per day. -She reports that his heart for him to quit as his wife "blows smoke his face" as she is not interested in quitting smoking -He feels he can control his cravings because he can take hard candy and does not require a cigarette -He is not interested in quitting at this time  Plan: Instruction/counseling given:  I counseled patient on the dangers of tobacco use, advised patient to stop smoking, and reviewed strategies to maximize success. Educational resources provided:  [employer]er, [employer](1-800-QUIT-NOW) brochure Self management tools provided:  smoking cessation plan (STAR Quit Plan) Medications to assist with smoking cessation:  None Patient agreed to the following self-care plans for smoking cessation: cut down the number of cigarettes smoked, go to the [employer] website (PumpkinSearch.com.eewww.quitlinenc.com)  Other plans: Continue to reassess at each visit

## 2014-05-14 NOTE — Patient Instructions (Signed)
Please try to bring all your medicines next time. This will help us keep you safe from mistakes.  We will call you about your bloodwork next week.

## 2014-05-14 NOTE — Assessment & Plan Note (Addendum)
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 -He feels what his boss is describing to him should be considered as age-related changes. -He still reports smoking 1-1-1/2 packs per day  Assessment -Labwork from one year prior with otherwise stable, including renal function, electrolytes, liver function tests, blood counts -Physical exam findings are reassuring for no gross abnormalities -Absence of mood symptoms makes depression or adjustment disorder less likely in the setting of his recent unemployment  Plan -Check CMET, CBC, TSH today to see if anything has changed as his tobacco use does put him at increased risk for illness -Offered to perform fecal occult blood test though the patient was not agreeable to it  ADDENDUM 05/17/2014  3:02 PM:  BMET, CBC, TSH are otherwise unremarkable. I will call and advise him to follow-up if he continues to feel decreased energy.

## 2014-05-15 LAB — TSH: TSH: 3.607 u[IU]/mL (ref 0.350–4.500)

## 2014-05-17 ENCOUNTER — Telehealth: Payer: Self-pay | Admitting: Internal Medicine

## 2014-05-17 NOTE — Telephone Encounter (Signed)
I called the patient to review his most recent blood work with him. He acknowledged that the results were what he was expecting them to be. I also told him that if he were to feel as though the symptoms were persisting and that they were out of the ordinary, he should follow-up with us for further testing. 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

## 2014-05-18 NOTE — Telephone Encounter (Addendum)
Error entry

## 2014-06-28 ENCOUNTER — Ambulatory Visit (INDEPENDENT_AMBULATORY_CARE_PROVIDER_SITE_OTHER): Payer: Self-pay | Admitting: Family Medicine

## 2014-06-28 ENCOUNTER — Encounter: Payer: Self-pay | Admitting: Family Medicine

## 2014-06-28 VITALS — BP 139/86 | Ht 71.0 in | Wt 198.0 lb

## 2014-06-28 DIAGNOSIS — M25511 Pain in right shoulder: Secondary | ICD-10-CM | POA: Insufficient documentation

## 2014-06-28 MED ORDER — METHYLPREDNISOLONE ACETATE 40 MG/ML IJ SUSP
40.0000 mg | Freq: Once | INTRAMUSCULAR | Status: AC
  Administered 2014-06-28: 40 mg via INTRA_ARTICULAR

## 2014-06-28 NOTE — Assessment & Plan Note (Signed)
Presentation related to lifetime of labor. Possibly a rotator cuff tear vs bicep tendonitis vs osteoarthritis vs combination.  Subacromial injection of right shoulder  Bicep tendon sheath injection of right arm  Right shoulder x-ray  Follow-up in 2-3 weeks

## 2014-06-28 NOTE — Progress Notes (Signed)
  Clarence LlanosJeffrey Lewis - 59 y.o. male MRN [id number]  Date of birth: January 03, 1956    SUBJECTIVE:     Patient presents with follow-up of right shoulder pain. He was treated with a shoulder injection last year which helped for some time, but the pain has returned. Pain does not radiate. He reports no associated numbness or weakness but sometimes hears cracking with certain shoulder movements. 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 He states he is using a Marketing executivemuscle massager which helps sometimes with his pain. He reports no injuries. ROS:     Musculoskeletal: pain, especially with overhead movements of right arm. No weakness.  PERTINENT  PMH / PSH FH / / SH:  Past Medical, Surgical, Social, and Family History Reviewed & Updated in the EMR.  Pertinent findings include:  Subacromial injection (2015)  OBJECTIVE: BP 139/86 mmHg  Ht 5\' 11"  (1.803 m)  Wt 198 lb (89.812 kg)  BMI 27.63 kg/m2  Physical Exam:  Vital signs are reviewed. Musculoskeletal: Right shoulder: No obvious deformity crepitus on shoulder extension. Mild tenderness along bicipital groove. Full active extension of shoulder. Limited Apley and lift off. Empty can negative. Intern and external rotation slightly limited. Speeds test positive. Yergason negative. 4/5 strength of internal/external rotation. 4/5 grip strength.  INJECTION (1 of 2): Patient was given informed consent, signed copy in the chart. Appropriate time out was taken. Area prepped and draped in usual sterile fashion. 0.5 cc of methylprednisolone 40 mg/ml plus  1.5 cc of 1% lidocaine without epinephrine was injected into the subacromial space of right shoulder using a(n) posterior approach. The patient tolerated the procedure well. There were no complications. Post procedure instructions were given.  INJECTION (2 of 2): Patient was given informed consent, signed copy in the chart. Appropriate time out was taken. Area prepped and draped in  usual sterile fashion. 0.5 cc of methylprednisolone 40 mg/ml plus  1.5 cc of 1% lidocaine without epinephrine was injected into the bicep tendon sheath using a(n) direct approach. The patient tolerated the procedure well. There were no complications. Post procedure instructions were given.  ASSESSMENT & PLAN:  See problem based charting & AVS for pt instructions.

## 2014-06-29 NOTE — Progress Notes (Signed)
Patient ID: Clarence Lewis, male   DOB: 10-01-1955, 59 y.o.   MRN: [id number] [facility] Attending Note: I have seen and examined this patient. I have discussed this patient with the resident and reviewed the assessment and plan as documented above. I agree with the resident's findings and plan.  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 I am hesitant  To continue to do serial subacromial injections but he says he is haivng trouble working and thus supporting himself secondary to pain. On exam today he actually has less RC sx and some new sx of bicipital tendinitis so we split the injection. Will get some imaging as we have none and see him back in 2 weeks for re-eval.  notablyhis foot is 90% improved

## 2014-06-30 ENCOUNTER — Ambulatory Visit
Admission: RE | Admit: 2014-06-30 | Discharge: 2014-06-30 | Disposition: A | Discharge status: Home / Self Care | Payer: No Typology Code available for payment source | Source: Ambulatory Visit | Attending: Family Medicine | Admitting: Family Medicine

## 2014-06-30 DIAGNOSIS — M25511 Pain in right shoulder: Secondary | ICD-10-CM

## 2014-07-12 ENCOUNTER — Encounter: Payer: Self-pay | Admitting: Family Medicine

## 2014-07-12 ENCOUNTER — Ambulatory Visit (INDEPENDENT_AMBULATORY_CARE_PROVIDER_SITE_OTHER): Payer: No Typology Code available for payment source | Admitting: Family Medicine

## 2014-07-12 VITALS — BP 125/85 | Ht 71.0 in | Wt 198.0 lb

## 2014-07-12 DIAGNOSIS — M25511 Pain in right shoulder: Secondary | ICD-10-CM

## 2014-07-12 NOTE — Progress Notes (Signed)
  Clarence LlanosJeffrey Lewis - 59 y.o. male MRN [id number]  Date of birth: 1955/05/25    SUBJECTIVE:     Patient presents for follow-up of right shoulder pain. Symptoms have significant improved since receiving injections. Pain is minimal and he has more function with his arm. No side effects from injection noted  ROS:     Constitutional: No fevers Musculoskeletal: No weakness  PERTINENT  PMH / PSH FH / / SH:  Past Medical, Surgical, Social, and Family History Reviewed & Updated in the EMR.  Pertinent findings include:  Subacromial injection (2015)  OBJECTIVE: BP 125/85 mmHg  Ht 5\' 11"  (1.803 m)  Wt 198 lb (89.812 kg)  BMI 27.63 kg/m2  Physical Exam:  Vital signs are reviewed. Musculoskeletal: Right shoulder: No obvious deformity crepitus on shoulder extension. No tenderness along bicipital groove. Full active extension of shoulder. Limited Apley and lift off but improved from previous exam. Empty can negative. Internal and external rotation full. Speeds test negative. 5/5 strength of internal/external rotation. 5/5 grip strength.  ASSESSMENT & PLAN:  See problem based charting & AVS for pt instructions.

## 2014-07-13 NOTE — Assessment & Plan Note (Signed)
X-ray significant for degenerative disease and acromial spurring. Discussed options with patient regarding possible need for acromioplasty at some point. At this time, however, patient with adequate relief from injections  Follow-up PRN

## 2014-07-13 NOTE — Progress Notes (Signed)
Patient ID: Clarence Lewis, male   DOB: [birth year], 59 y.o.   MRN: [id number] [facility] Attending Note: I have seen and examined this patient. I have discussed this patient with the resident and reviewed the assessment and plan as documented above. I agree with the resident's findings and plan. He actually had significant improvement from injection last time. We did a repeat subacromial injection and we added a bicipital tendon sheath injection which is what I think really helped him. I discussed with him that he needs to keep up with his exercises. Eventually he will likely have recurrent says this is been very chronic for a long time. Certainly, at some point he may need to be evaluated for possible acromioplasty. He is quite happy with the improvement.

## 2016-02-04 IMAGING — CR DG SHOULDER 2+V*R*
3 series · 3 of 3 positions shown · non-contrast
Comparison: None.

CLINICAL DATA: Right shoulder pain, decreased range of motion, no
acute injury

EXAM:
RIGHT SHOULDER - 2+ VIEW

[w shoulder ap internal righ]
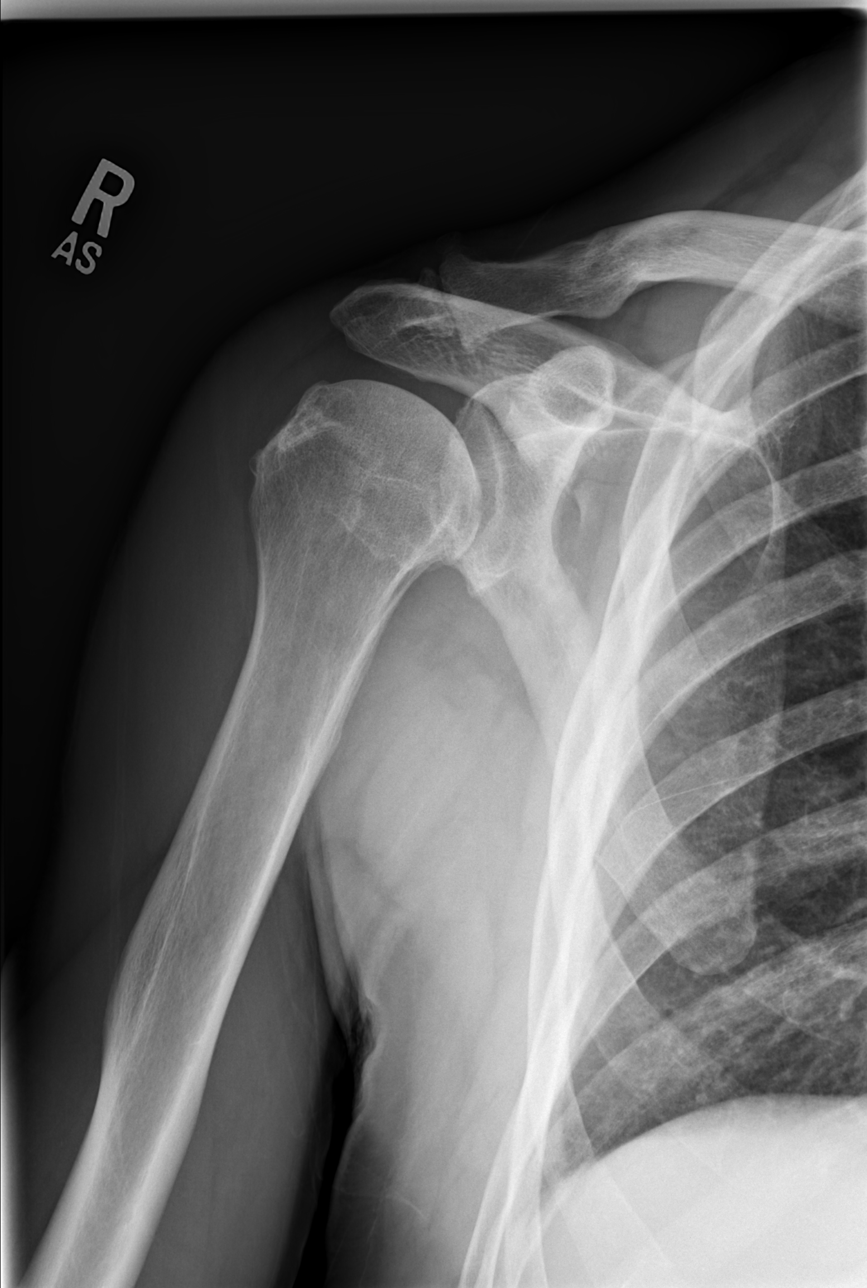

[w shoulder y view right]
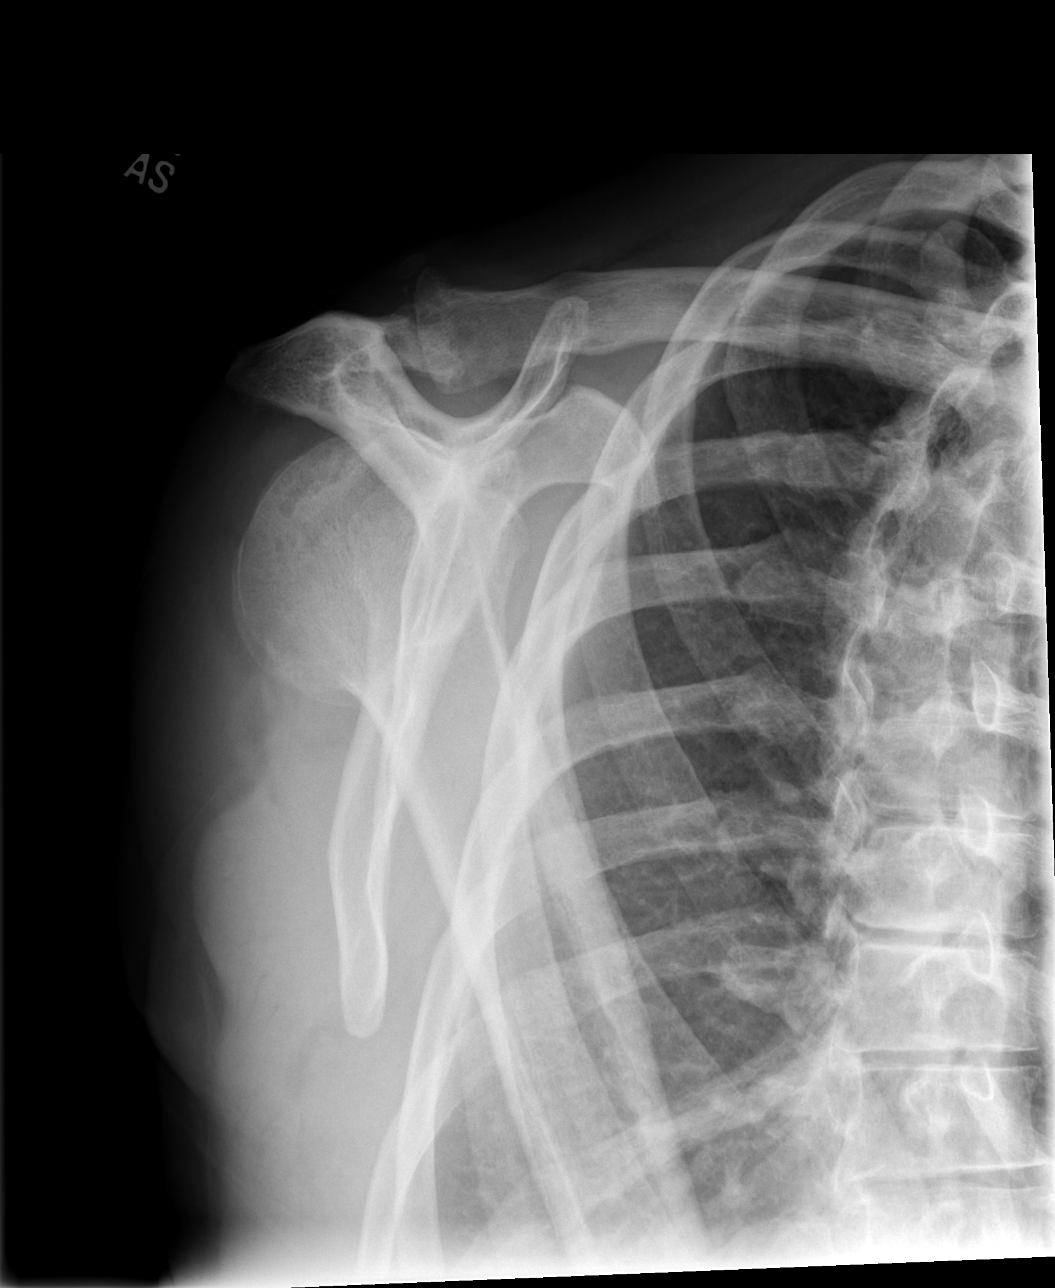

[w shoulder axillary right *]
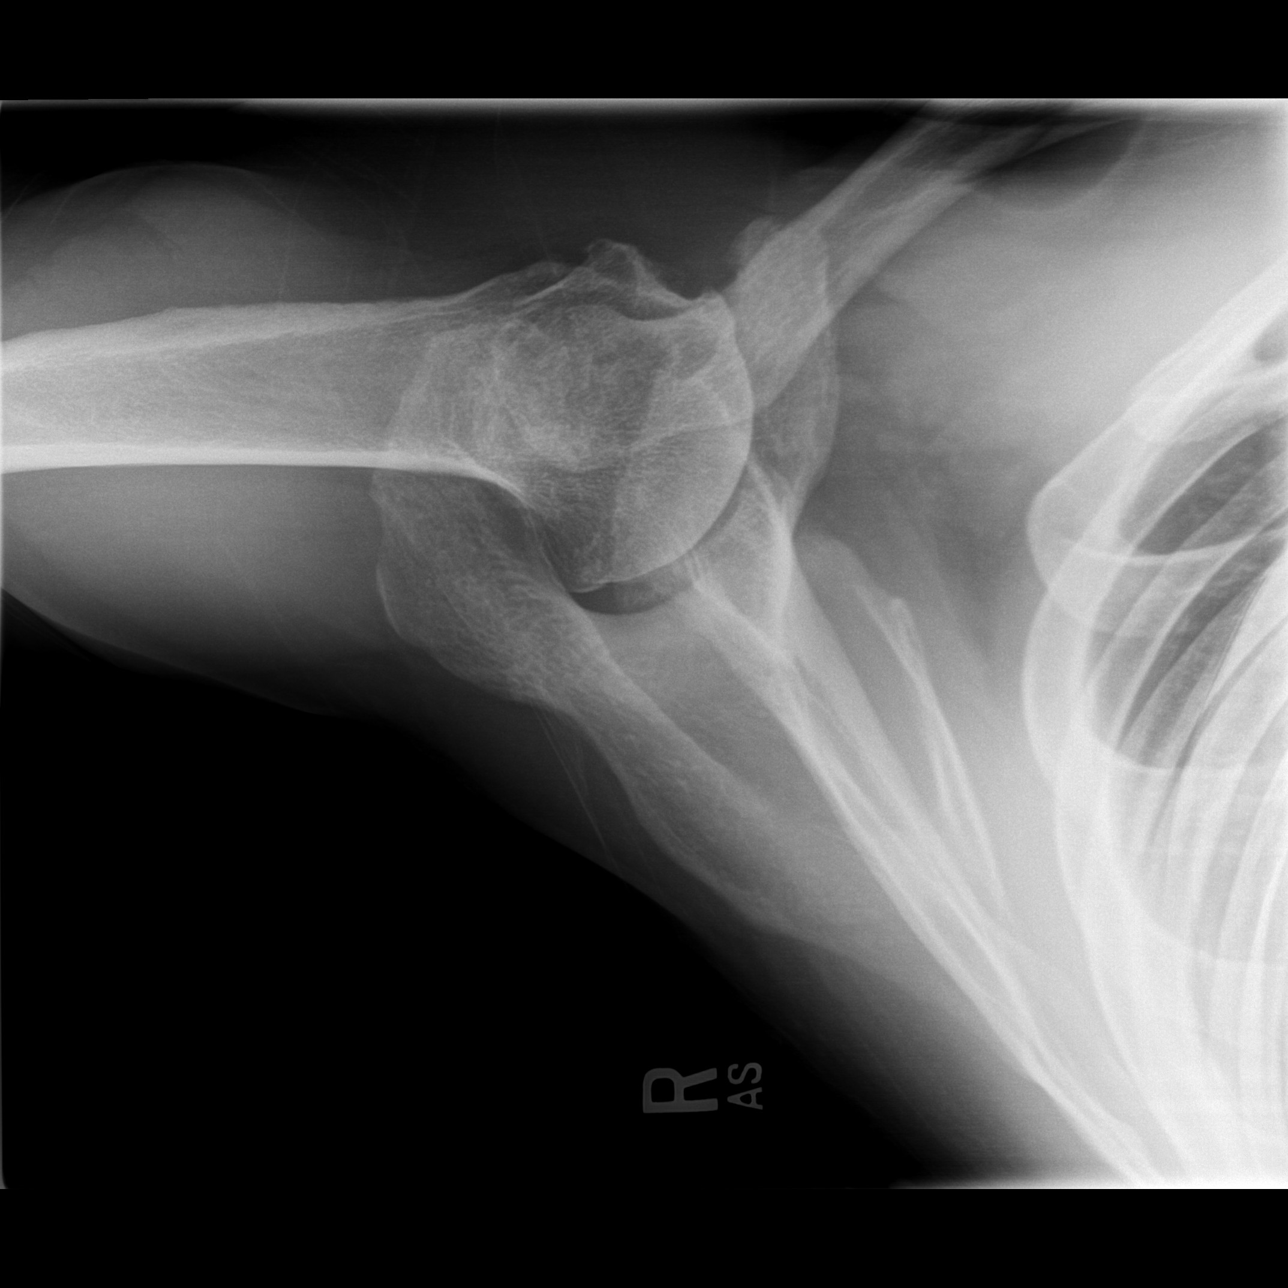

[3 of 3 positions shown; findings below may reference images not displayed]

FINDINGS: There is moderate degenerative joint disease of the right shoulder
with some loss of joint space particularly inferiorly. There is
sclerosis and spurring from the inferior glenoid. Degenerative
spurring is noted at the acromion. This can result in impingement
and chronic rotator cuff disease. No acute abnormality is seen.
IMPRESSION: 1. Moderate degenerative joint disease of the right shoulder.
2. Acromial spurring.  Possible impingement.

## 2016-06-29 ENCOUNTER — Ambulatory Visit (INDEPENDENT_AMBULATORY_CARE_PROVIDER_SITE_OTHER): Payer: BLUE CROSS/BLUE SHIELD | Admitting: Family Medicine

## 2016-06-29 ENCOUNTER — Encounter: Payer: Self-pay | Admitting: Family Medicine

## 2016-06-29 ENCOUNTER — Encounter (INDEPENDENT_AMBULATORY_CARE_PROVIDER_SITE_OTHER): Payer: Self-pay

## 2016-06-29 VITALS — BP 120/70 | Ht 71.0 in | Wt 195.0 lb

## 2016-06-29 DIAGNOSIS — M25561 Pain in right knee: Secondary | ICD-10-CM

## 2016-06-29 MED ORDER — METHYLPREDNISOLONE ACETATE 40 MG/ML IJ SUSP
40.0000 mg | Freq: Once | INTRAMUSCULAR | Status: AC
  Administered 2016-06-29: 40 mg via INTRA_ARTICULAR

## 2016-06-29 NOTE — Progress Notes (Signed)
  Clarence Lewis - 61 y.o. male MRN [id number]  Date of birth: 04-Sep-1955  SUBJECTIVE:  Including CC & ROS.   Clarence Lewis is a 61 yo M that is presenting with right/left knee pain. The pain started 1 ago. He has been taking no medications. He denies any inciting event. History of surgery on his knee before. Has some swelling. Hasn't been able to work all week due to the pain. He denies any locking or giving way. He has received a steroid injection in other areas but not his knees.   ROS: No unexpected weight loss, fever, chills, swelling, instability, muscle pain, numbness/tingling, redness, otherwise see HPI    HISTORY: Past Medical, Surgical, Social, and Family History Reviewed & Updated per EMR.   Pertinent Historical Findings include: PMSHx -   ED PSHx -  Current smoker, occasional EtOH use  FHx -  None   DATA REVIEWED: none  PHYSICAL EXAM:  VS: BP:120/70  HR: bpm  TEMP: ( )  RESP:   HT:5\' 11"  (180.3 cm)   WT:195 lb (88.5 kg)  BMI:27.3 PHYSICAL EXAM: Gen: NAD, alert, cooperative with exam, well-appearing HEENT: clear conjunctiva, EOMI CV:  no edema, capillary refill brisk,  Resp: non-labored, normal speech Skin: no rashes, normal turgor  Neuro: no gross deficits.  Psych:  alert and oriented Right Knee: Normal to inspection with no erythema or effusion or obvious bony abnormalities. Palpation normal with no warmth, patellar tenderness, or condyle tenderness. Some TTP of the medial and lateral joint line.  ROM full in flexion and extension and lower leg rotation. Ligaments with solid consistent endpoints including  LCL, MCL. Negative Mcmurray's Non painful patellar compression. Patellar glide without crepitus. Patellar and quadriceps tendons unremarkable. Hamstring and quadriceps strength is normal.  Neurovascularly intact    Aspiration/Injection Procedure Note Clarence Lewis [birth year]    Procedure: Injection Indications: right knee  pain  Procedure Details Consent: Risks of procedure as well as the alternatives and risks of each were explained to the (patient/caregiver).  Consent for procedure obtained. Time Out: Verified patient identification, verified procedure, site/side was marked, verified correct patient position, special equipment/implants available, medications/allergies/relevent history reviewed, required imaging and test results available.  Performed.  The area was cleaned with iodine and alcohol swabs.    The right knee was injected using 1 cc's of 40 mg Depomedrol and 4 cc's of 1% lidocaine with a 22 1 1/2" needle.    A sterile dressing was applied.  Patient did tolerate procedure well.  ASSESSMENT & PLAN:   Right knee pain Most likely related to some underlying OA. Hx of knee surgery but cannot tell what surgery was.  - injection today  - knee x-rays - f/u PRN

## 2016-06-29 NOTE — Progress Notes (Signed)
[facility]: Attending Note: I have reviewed the chart, discussed wit the Sports Medicine Fellow. I agree with assessment and treatment plan as detailed in the Fellow's note.  

## 2016-07-02 DIAGNOSIS — M25561 Pain in right knee: Secondary | ICD-10-CM | POA: Insufficient documentation

## 2016-07-02 NOTE — Assessment & Plan Note (Signed)
Most likely related to some underlying OA. Hx of knee surgery but cannot tell what surgery was.  - injection today  - knee x-rays - f/u PRN

## 2018-03-06 ENCOUNTER — Ambulatory Visit (INDEPENDENT_AMBULATORY_CARE_PROVIDER_SITE_OTHER): Payer: Self-pay | Admitting: Internal Medicine

## 2018-03-06 ENCOUNTER — Encounter: Payer: Self-pay | Admitting: Internal Medicine

## 2018-03-06 ENCOUNTER — Other Ambulatory Visit: Payer: Self-pay

## 2018-03-06 VITALS — BP 139/98 | HR 80 | Temp 98.7°F | Ht 71.0 in | Wt 190.2 lb

## 2018-03-06 DIAGNOSIS — F320 Major depressive disorder, single episode, mild: Secondary | ICD-10-CM

## 2018-03-06 DIAGNOSIS — G8929 Other chronic pain: Secondary | ICD-10-CM

## 2018-03-06 DIAGNOSIS — M1990 Unspecified osteoarthritis, unspecified site: Secondary | ICD-10-CM

## 2018-03-06 DIAGNOSIS — M159 Polyosteoarthritis, unspecified: Secondary | ICD-10-CM

## 2018-03-06 DIAGNOSIS — F32A Depression, unspecified: Secondary | ICD-10-CM | POA: Insufficient documentation

## 2018-03-06 DIAGNOSIS — Z599 Problem related to housing and economic circumstances, unspecified: Secondary | ICD-10-CM

## 2018-03-06 DIAGNOSIS — F329 Major depressive disorder, single episode, unspecified: Secondary | ICD-10-CM | POA: Insufficient documentation

## 2018-03-06 MED ORDER — MELOXICAM 7.5 MG PO TABS: 7.5000 mg | ORAL_TABLET | Freq: Every day | ORAL | 2 refills | Status: AC

## 2018-03-06 NOTE — Progress Notes (Signed)
   CC: Shoulder, Hip, Knee Pain  HPI:  [name] is a 62 y.o. M with PMHx listed below presenting for Shoulder, Hip, Knee Pain. Please see the A&P for the status of the patient's chronic medical problems.   No past medical history on file. Review of Systems:  Performed and all others negative.  Physical Exam:  Vitals:   03/06/18 1000  BP: (!) 139/98  Pulse: 80  Temp: 98.7 F (37.1 C)  TempSrc: Oral  SpO2: 100%  Weight: 190 lb 3.2 oz (86.3 kg)  Height: 5\' 11"  (1.803 m)   Physical Exam Constitutional:      Appearance: Normal appearance.     Comments: Mildly uncomfortable  Cardiovascular:     Rate and Rhythm: Normal rate and regular rhythm.     Heart sounds: Normal heart sounds.  Pulmonary:     Effort: Pulmonary effort is normal. No respiratory distress.     Breath sounds: Normal breath sounds.  Abdominal:     General: Bowel sounds are normal. There is no distension.     Palpations: Abdomen is soft.     Tenderness: There is no abdominal tenderness.  Musculoskeletal:        General: No swelling or tenderness.     Comments: ?Brouchard's nodes bilateral hands No edema, erythema, nor warmth of joints Reduced ROM or RLE at knee Slow to abduct RUE at shoulder  Skin:    General: Skin is warm and dry.     Assessment & Plan:   See Encounters Tab for problem based charting.  Patient discussed with Dr. Heide SparkNarendra

## 2018-03-06 NOTE — Patient Instructions (Addendum)
Thank you for allowing us to care for you  For your Shoulder, Hip, and Knee pain - We have prescribed a pain medication to be taken daily  We have given you a referral to speak with out counselor  We will work on finding financial assistance for you  Please follow up in about 6 moths or earlier if needed

## 2018-03-06 NOTE — Assessment & Plan Note (Signed)
Mild depression related to his ongoing pain and inability to work. PHQ-9 of 9 today. Open to speaking with counseling, which I think he will benefit from as he discussed many of his ongoing issues/stressors with myself during his interview. [facility]
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
## 2018-03-06 NOTE — Progress Notes (Signed)
[facility] Medicine Clinic Attending  Case discussed with Dr. Melvin  at the time of the visit.  We reviewed the resident's history and exam and pertinent patient test results.  I agree with the assessment, diagnosis, and plan of care documented in the resident's note.  

## 2018-03-11 ENCOUNTER — Telehealth: Payer: Self-pay | Admitting: Licensed Clinical Social Worker

## 2018-03-11 NOTE — Telephone Encounter (Signed)
Patient was contacted due to a [facility]. Patient was not available at the time of the call. Patient will be contacted again tomorrow to discuss the referral.

## 2018-03-17 ENCOUNTER — Telehealth: Payer: Self-pay | Admitting: Licensed Clinical Social Worker

## 2018-03-17 ENCOUNTER — Encounter: Payer: Self-pay | Admitting: Licensed Clinical Social Worker

## 2018-03-17 NOTE — Telephone Encounter (Signed)
Called the patient to follow up on the recent referral. Patient was not available. A letter was also mailed today in attempt to schedule an appointment for the patient.

## 2018-03-24 NOTE — Addendum Note (Signed)
Addended by: Neomia DearPOWERS, Jaedyn Lard E on: 03/24/2018 09:35 AM   Modules accepted: Orders

## 2018-06-18 ENCOUNTER — Ambulatory Visit: Payer: Self-pay

## 2018-06-18 ENCOUNTER — Other Ambulatory Visit: Payer: Self-pay

## 2019-06-17 ENCOUNTER — Ambulatory Visit: Payer: Self-pay | Admitting: Internal Medicine

## 2019-06-17 VITALS — BP 158/102 | HR 99 | Temp 98.4°F | Wt 191.4 lb

## 2019-06-17 DIAGNOSIS — I10 Essential (primary) hypertension: Secondary | ICD-10-CM

## 2019-06-17 DIAGNOSIS — M1990 Unspecified osteoarthritis, unspecified site: Secondary | ICD-10-CM

## 2019-06-17 DIAGNOSIS — R12 Heartburn: Secondary | ICD-10-CM

## 2019-06-17 DIAGNOSIS — R030 Elevated blood-pressure reading, without diagnosis of hypertension: Secondary | ICD-10-CM

## 2019-06-17 DIAGNOSIS — M25461 Effusion, right knee: Secondary | ICD-10-CM

## 2019-06-17 DIAGNOSIS — M23004 Cystic meniscus, unspecified medial meniscus, left knee: Secondary | ICD-10-CM

## 2019-06-17 DIAGNOSIS — R531 Weakness: Secondary | ICD-10-CM

## 2019-06-17 DIAGNOSIS — M25561 Pain in right knee: Secondary | ICD-10-CM

## 2019-06-17 DIAGNOSIS — Z0000 Encounter for general adult medical examination without abnormal findings: Secondary | ICD-10-CM

## 2019-06-17 DIAGNOSIS — M19011 Primary osteoarthritis, right shoulder: Secondary | ICD-10-CM

## 2019-06-17 NOTE — Progress Notes (Signed)
   CC: knee pain  HPI:  Clarence Lewis is a 64 y.o. with PMH as below.   Please see A&P for assessment of the patient's acute and chronic medical conditions.   No past medical history on file.   Review of Systems:  Review of Systems  Gastrointestinal: Positive for heartburn. Negative for abdominal pain, nausea and vomiting.  Musculoskeletal: Positive for joint pain and myalgias. Negative for falls.  Neurological: Positive for focal weakness. Negative for dizziness, tingling, sensory change and headaches.    Physical Exam: Constitution: NAD, appears stated age Cardio: regular rate and rhythm, n m/r/g, no LE edema  Respiratory: CTA, no w/r/r MSK: knee flexion strength b/l 4/5, leg strength otherwise 5/5. +crepitis bilaterally, full active and passive ROM, pain with varus and valgus left leg Neuro: normal affect, a&ox3 Skin: c/d/i   Vitals:   06/17/19 1446  BP: (!) 158/102  Pulse: 99  Temp: 98.4 F (36.9 C)  TempSrc: Oral  SpO2: 99%  Weight: 191 lb 6.4 oz (86.8 kg)    Assessment & Plan:   See Encounters Tab for problem based charting.  Patient discussed with Dr. Antony Contras

## 2019-06-17 NOTE — Patient Instructions (Addendum)
Thank you for allowing [place] to provide your care today. Today we discussed your knee and hand pain    I have ordered the following labs for you:  Basic metabolic panel, complete blood count, and an x-ray.    I will call if any are abnormal.    Please follow-up in two weeks for repeat blood pressure check.    Please call the internal medicine center clinic if you have any questions or concerns, we may be able to help and keep you from a long and expensive emergency room wait. Our clinic and after hours phone number is [phone], the best time to call is Monday through Friday 9 am to 4 pm but there is always someone available 24/7 if you have an emergency. If you need medication refills please notify your pharmacy one week in advance and they will send [place] a request.

## 2019-06-17 NOTE — Assessment & Plan Note (Signed)
BP Readings from Last 3 Encounters:  06/17/19 (!) 158/102  03/06/18 (!) 139/98  06/29/16 120/70   Blood pressure elevated today. He has not been to a doctor in a long time. He is having significant pain today. He endorses drinking lots of high sugar soft drinks and does not like water. He is very active with his job.   - f/u two weeks for repeat blood pressure check  - lipid panel  - bmp

## 2019-06-17 NOTE — Assessment & Plan Note (Addendum)
History of OA with imaging in his right shoulder. He was supposed to have imaging of his knees when seeing sports medicine in 2018 but did not follow-up. He takes goody powders frequently, which is the only thing that helps his knee pain. The right knee usually hurts worse than the left but lately the left has hurt more. He endorses lumps on his knees that come up and then go down on their own in different spots. He has crepitus in both knees, full active and passive ROM. He does have pain with varus and valgus. He has difficulty walking due to pain.   - xr left knee  - advised stopping goody powders - will likely need ppi at follow-up - f/u in two weeks  - tylenol, alternate ice and heat - handicap decal filled out  - needs orange card, appointment scheduled

## 2019-06-17 NOTE — Assessment & Plan Note (Signed)
-   colon cancer screening - FOBT due - discuss at f/u in two weeks.

## 2019-06-17 NOTE — Assessment & Plan Note (Addendum)
There is a firm but compressible swelling over the right medial meniscal area. This is TTP. Bedside US shows simple hypoechoic firm but compressible fluid filled sac, which may represent ganglion cyst. He states it has been present for about the past year. It is TTP. He has pain with varus and valgus applied to the knee. He has a family history of bone cancer in a cousin, his mother and grandfather. He is not sure what kind of where their cancer was located.   - xr knee - cbc with diff  - consider bedside drainage if no other significant findings vs. Referral to ortho. He does not have insurance currently although is planning to renew his orange card.

## 2019-06-18 LAB — CMP14 + ANION GAP
ALT: 12 IU/L (ref 0–44)
AST: 15 IU/L (ref 0–40)
Albumin/Globulin Ratio: 2 (ref 1.2–2.2)
Albumin: 4.5 g/dL (ref 3.8–4.8)
Alkaline Phosphatase: 90 IU/L (ref 39–117)
Anion Gap: 14 mmol/L (ref 10.0–18.0)
BUN/Creatinine Ratio: 11 (ref 10–24)
BUN: 12 mg/dL (ref 8–27)
Bilirubin Total: 0.3 mg/dL (ref 0.0–1.2)
CO2: 25 mmol/L (ref 20–29)
Calcium: 9.5 mg/dL (ref 8.6–10.2)
Chloride: 102 mmol/L (ref 96–106)
Creatinine, Ser: 1.1 mg/dL (ref 0.76–1.27)
GFR calc Af Amer: 83 mL/min/{1.73_m2} (ref >59)
GFR calc non Af Amer: 72 mL/min/{1.73_m2} (ref >59)
Globulin, Total: 2.3 g/dL (ref 1.5–4.5)
Glucose: 84 mg/dL (ref 65–99)
Potassium: 4.5 mmol/L (ref 3.5–5.2)
Sodium: 141 mmol/L (ref 134–144)
Total Protein: 6.8 g/dL (ref 6.0–8.5)

## 2019-06-18 LAB — CBC WITH DIFFERENTIAL/PLATELET
Basophils Absolute: 0.1 10*3/uL (ref 0.0–0.2)
Basos: 1 %
EOS (ABSOLUTE): 0.1 10*3/uL (ref 0.0–0.4)
Eos: 1 %
Hematocrit: 49.5 % (ref 37.5–51.0)
Hemoglobin: 16.9 g/dL (ref 13.0–17.7)
Immature Grans (Abs): 0 10*3/uL (ref 0.0–0.1)
Immature Granulocytes: 0 %
Lymphocytes Absolute: 2.7 10*3/uL (ref 0.7–3.1)
Lymphs: 27 %
MCH: 30.7 pg (ref 26.6–33.0)
MCHC: 34.1 g/dL (ref 31.5–35.7)
MCV: 90 fL (ref 79–97)
Monocytes Absolute: 0.7 10*3/uL (ref 0.1–0.9)
Monocytes: 7 %
Neutrophils Absolute: 6.5 10*3/uL (ref 1.4–7.0)
Neutrophils: 64 %
Platelets: 245 10*3/uL (ref 150–450)
RBC: 5.51 x10E6/uL (ref 4.14–5.80)
RDW: 13.5 % (ref 11.6–15.4)
WBC: 10.1 10*3/uL (ref 3.4–10.8)

## 2019-06-18 LAB — LIPID PANEL
Chol/HDL Ratio: 3.8 ratio (ref 0.0–5.0)
Cholesterol, Total: 165 mg/dL (ref 100–199)
HDL: 44 mg/dL (ref >39)
LDL Chol Calc (NIH): 106 mg/dL — ABNORMAL HIGH (ref 0–99)
Triglycerides: 76 mg/dL (ref 0–149)
VLDL Cholesterol Cal: 15 mg/dL (ref 5–40)

## 2019-07-10 NOTE — Progress Notes (Signed)
Internal Medicine Clinic Attending  Case discussed with Dr. Seawell at the time of the visit.  We reviewed the resident's history and exam and pertinent patient test results.  I agree with the assessment, diagnosis, and plan of care documented in the resident's note.    

## 2019-07-13 ENCOUNTER — Other Ambulatory Visit: Payer: Self-pay

## 2019-07-13 ENCOUNTER — Ambulatory Visit: Payer: Self-pay

## 2019-07-13 ENCOUNTER — Ambulatory Visit: Payer: Self-pay | Admitting: Internal Medicine

## 2019-07-13 DIAGNOSIS — R030 Elevated blood-pressure reading, without diagnosis of hypertension: Secondary | ICD-10-CM

## 2019-07-13 NOTE — Patient Instructions (Addendum)
Clarence Lewis,  It was a pleasure meeting you!  Today we discussed your blood pressure. Since you do not wish to go on any medications, you can continue lifestyle modifications like we discussed: low salt diet, weight loss. The remaining factor contributing to your blood pressure would be smoking. We are happy to help if you decide you're ready to quit in the future.   Take care, Dr. Chesley Mires

## 2019-07-15 ENCOUNTER — Encounter: Payer: Self-pay | Admitting: Internal Medicine

## 2019-07-15 NOTE — Assessment & Plan Note (Addendum)
Patient's blood pressure remains elevated today. He notes that his wife usually cooks with a lot of salt, but he has made significant efforts in cutting this out of his diet. He also hopes to increase activity, as he realizes he has gained some weight since retiring.  He is adamant that he does not want to go on any blood pressure medications, even after discussing the longterm risks of untreated elevated blood pressure. He has seen friends have fairly significant side effects and doesn't want to take that risk.  I mentioned to him that the other lifestyle change he could make that is affecting is blood pressure is smoking cessation which he was not interested in.  Advised him to continue ongoing discussions with PCP.

## 2019-07-15 NOTE — Progress Notes (Signed)
Internal Medicine Clinic Attending  Case discussed with Dr. Bloomfield at the time of the visit.  We reviewed the resident's history and exam and pertinent patient test results.  I agree with the assessment, diagnosis, and plan of care documented in the resident's note.  

## 2019-07-15 NOTE — Progress Notes (Signed)
Acute Office Visit  Subjective:    Patient ID: Clarence Lewis, male    DOB: Feb 18, 1956, 64 y.o.   MRN: [id number]  Chief Complaint  Patient presents with  . Follow-up    BP  . Form Completion    Disability    HPI Patient is in today for follow-up on elevated blood pressure and disability form completion.   I explained to Mr. Coye that disability paperwork is typically filled out by the PCP, but either way there was no way it could be completed while he was here today. He immediately became quite frustrated and left the office abruptly. Prior to this, we were able to discuss his blood pressure somewhat. Please see details under problem list.   No past medical history on file.  Past Surgical History:  Procedure Laterality Date  . KNEE SURGERY     Arthroscopic, [facility]- about 20 yrs before    Family History  Problem Relation Age of Onset  . Diabetes Mother   . Cancer Mother 69       died of bone cancer  . Parkinson's disease Father 61    Social History   Socioeconomic History  . Marital status: Married    Spouse name: Not on file  . Number of children: Not on file  . Years of education: Not on file  . Highest education level: Not on file  Occupational History  . Not on file  Tobacco Use  . Smoking status: Current Every Day Smoker    Packs/day: 1.00    Years: 41.00    Pack years: 41.00    Types: Cigarettes  . Smokeless tobacco: Never Used  . Tobacco comment: Sometimes less.  Substance and Sexual Activity  . Alcohol use: Yes    Alcohol/week: 2.0 standard drinks    Types: 2 Shots of liquor per week    Comment: Rarely.  . Drug use: No  . Sexual activity: Not on file  Other Topics Concern  . Not on file  Social History Narrative   Lives with his wife in [place].   Has kids, who lives in [place] and are healthy.   Works Herbalist, Architect and other related part-time jobs.    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   Plays guitar since he was 64 years old.   Social Determinants of Health   Financial Resource Strain:   . Difficulty of Paying Living Expenses:   Food Insecurity:   . Worried About Charity fundraiser in the Last Year:   . Arboriculturist in the Last Year:   Transportation Needs:   . Film/video editor (Medical):   Marland Kitchen Lack of Transportation (Non-Medical):   Physical Activity:   . Days of Exercise per Week:   . Minutes of Exercise per Session:   Stress:   . Feeling of Stress :   Social Connections:   . Frequency of Communication with Friends and Family:   . Frequency of Social Gatherings with Friends and Family:   . Attends Religious Services:   . Active Member of Clubs or Organizations:   . Attends Archivist Meetings:   Marland Kitchen Marital Status:   Intimate Partner Violence:   . Fear of Current or Ex-Partner:   . Emotionally Abused:   Marland Kitchen Physically Abused:   . Sexually Abused:     Outpatient Medications Prior to Visit  Medication Sig Dispense Refill  . fish oil-omega-3 fatty acids 1000  MG capsule Take 2 g by mouth daily.     No facility-administered medications prior to visit.    No Known Allergies  Review of Systems     Objective:    Physical Exam  BP (!) 151/88 (BP Location: Left Arm, Patient Position: Sitting, Cuff Size: Normal)   Pulse 70   Temp 98.5 F (36.9 C) (Oral)   SpO2 99%  Wt Readings from Last 3 Encounters:  06/17/19 191 lb 6.4 oz (86.8 kg)  03/06/18 190 lb 3.2 oz (86.3 kg)  06/29/16 195 lb (88.5 kg)    Health Maintenance Due  Topic Date Due  . Hepatitis C Screening  Never done  . HIV Screening  Never done  . COVID-19 Vaccine (1) Never done  . TETANUS/TDAP  Never done  . COLONOSCOPY  Never done  . COLON CANCER SCREENING ANNUAL FOBT  06/04/2014    There are no preventive care reminders to display for this patient.   Lab Results  Component Value Date   TSH 3.607 05/14/2014   Lab Results  Component Value Date    WBC 10.1 06/17/2019   HGB 16.9 06/17/2019   HCT 49.5 06/17/2019   MCV 90 06/17/2019   PLT 245 06/17/2019   Lab Results  Component Value Date   NA 141 06/17/2019   K 4.5 06/17/2019   CO2 25 06/17/2019   GLUCOSE 84 06/17/2019   BUN 12 06/17/2019   CREATININE 1.10 06/17/2019   BILITOT 0.3 06/17/2019   ALKPHOS 90 06/17/2019   AST 15 06/17/2019   ALT 12 06/17/2019   PROT 6.8 06/17/2019   ALBUMIN 4.5 06/17/2019   CALCIUM 9.5 06/17/2019   Lab Results  Component Value Date   CHOL 165 06/17/2019   Lab Results  Component Value Date   HDL 44 06/17/2019   Lab Results  Component Value Date   LDLCALC 106 (H) 06/17/2019   Lab Results  Component Value Date   TRIG 76 06/17/2019   Lab Results  Component Value Date   CHOLHDL 3.8 06/17/2019   No results found for: HGBA1C     Assessment & Plan:   Problem List Items Addressed This Visit    None       No orders of the defined types were placed in this encounter.    Bridget Hartshorn, DO

## 2020-09-09 ENCOUNTER — Encounter: Payer: Self-pay | Admitting: *Deleted

## 2021-02-22 DIAGNOSIS — Z13220 Encounter for screening for lipoid disorders: Secondary | ICD-10-CM | POA: Diagnosis not present

## 2021-02-22 DIAGNOSIS — Z0000 Encounter for general adult medical examination without abnormal findings: Secondary | ICD-10-CM | POA: Diagnosis not present

## 2021-02-22 DIAGNOSIS — Z136 Encounter for screening for cardiovascular disorders: Secondary | ICD-10-CM | POA: Diagnosis not present

## 2021-02-22 DIAGNOSIS — M25569 Pain in unspecified knee: Secondary | ICD-10-CM | POA: Diagnosis not present

## 2021-02-22 DIAGNOSIS — Z23 Encounter for immunization: Secondary | ICD-10-CM | POA: Diagnosis not present

## 2021-02-22 DIAGNOSIS — M791 Myalgia, unspecified site: Secondary | ICD-10-CM | POA: Diagnosis not present

## 2021-02-22 DIAGNOSIS — Z1159 Encounter for screening for other viral diseases: Secondary | ICD-10-CM | POA: Diagnosis not present

## 2021-10-03 DIAGNOSIS — K648 Other hemorrhoids: Secondary | ICD-10-CM | POA: Diagnosis not present

## 2021-10-03 DIAGNOSIS — Z1211 Encounter for screening for malignant neoplasm of colon: Secondary | ICD-10-CM | POA: Diagnosis not present

## 2021-10-03 DIAGNOSIS — K5730 Diverticulosis of large intestine without perforation or abscess without bleeding: Secondary | ICD-10-CM | POA: Diagnosis not present

## 2022-03-01 DIAGNOSIS — Z23 Encounter for immunization: Secondary | ICD-10-CM | POA: Diagnosis not present

## 2022-03-01 DIAGNOSIS — Z0000 Encounter for general adult medical examination without abnormal findings: Secondary | ICD-10-CM | POA: Diagnosis not present

## 2022-03-01 DIAGNOSIS — E7800 Pure hypercholesterolemia, unspecified: Secondary | ICD-10-CM | POA: Diagnosis not present

## 2022-08-16 DIAGNOSIS — H2513 Age-related nuclear cataract, bilateral: Secondary | ICD-10-CM | POA: Diagnosis not present

## 2022-08-16 DIAGNOSIS — H3581 Retinal edema: Secondary | ICD-10-CM | POA: Diagnosis not present

## 2022-08-16 DIAGNOSIS — H524 Presbyopia: Secondary | ICD-10-CM | POA: Diagnosis not present

## 2022-08-16 DIAGNOSIS — H40053 Ocular hypertension, bilateral: Secondary | ICD-10-CM | POA: Diagnosis not present

## 2022-08-16 DIAGNOSIS — H31011 Macula scars of posterior pole (postinflammatory) (post-traumatic), right eye: Secondary | ICD-10-CM | POA: Diagnosis not present

## 2022-08-16 DIAGNOSIS — H179 Unspecified corneal scar and opacity: Secondary | ICD-10-CM | POA: Diagnosis not present

## 2022-08-22 DIAGNOSIS — H2513 Age-related nuclear cataract, bilateral: Secondary | ICD-10-CM | POA: Diagnosis not present

## 2022-08-22 DIAGNOSIS — H31011 Macula scars of posterior pole (postinflammatory) (post-traumatic), right eye: Secondary | ICD-10-CM | POA: Diagnosis not present

## 2023-03-05 DIAGNOSIS — Z23 Encounter for immunization: Secondary | ICD-10-CM | POA: Diagnosis not present

## 2023-03-05 DIAGNOSIS — E7800 Pure hypercholesterolemia, unspecified: Secondary | ICD-10-CM | POA: Diagnosis not present

## 2023-03-05 DIAGNOSIS — Z0000 Encounter for general adult medical examination without abnormal findings: Secondary | ICD-10-CM | POA: Diagnosis not present

## 2023-03-05 DIAGNOSIS — F17200 Nicotine dependence, unspecified, uncomplicated: Secondary | ICD-10-CM | POA: Diagnosis not present

## 2023-03-05 DIAGNOSIS — Z13220 Encounter for screening for lipoid disorders: Secondary | ICD-10-CM | POA: Diagnosis not present
